# Patient Record
Sex: Female | Born: 1975 | Race: Black or African American | Hispanic: No | Marital: Single | Smoking: Former smoker
Health system: Southern US, Community
[De-identification: ages and names within clinical notes are randomized; demographics above are authoritative.]

## PROBLEM LIST (undated history)

## (undated) ENCOUNTER — Inpatient Hospital Stay (HOSPITAL_COMMUNITY): Payer: Self-pay

## (undated) DIAGNOSIS — R51 Headache: Secondary | ICD-10-CM

## (undated) DIAGNOSIS — E0590 Thyrotoxicosis, unspecified without thyrotoxic crisis or storm: Secondary | ICD-10-CM

## (undated) DIAGNOSIS — N390 Urinary tract infection, site not specified: Secondary | ICD-10-CM

## (undated) DIAGNOSIS — Z973 Presence of spectacles and contact lenses: Secondary | ICD-10-CM

## (undated) DIAGNOSIS — F419 Anxiety disorder, unspecified: Secondary | ICD-10-CM

## (undated) DIAGNOSIS — F32A Depression, unspecified: Secondary | ICD-10-CM

## (undated) DIAGNOSIS — F329 Major depressive disorder, single episode, unspecified: Secondary | ICD-10-CM

## (undated) DIAGNOSIS — R011 Cardiac murmur, unspecified: Secondary | ICD-10-CM

## (undated) HISTORY — PX: NO PAST SURGERIES: SHX2092

---

## 1998-11-07 ENCOUNTER — Emergency Department (HOSPITAL_COMMUNITY): Admission: EM | Admit: 1998-11-07 | Discharge: 1998-11-07 | Payer: Self-pay | Admitting: Emergency Medicine

## 2000-12-02 ENCOUNTER — Emergency Department (HOSPITAL_COMMUNITY): Admission: EM | Admit: 2000-12-02 | Discharge: 2000-12-02 | Payer: Self-pay | Admitting: Emergency Medicine

## 2001-04-14 ENCOUNTER — Other Ambulatory Visit: Admission: RE | Admit: 2001-04-14 | Discharge: 2001-04-14 | Payer: Self-pay | Admitting: Obstetrics and Gynecology

## 2002-04-28 ENCOUNTER — Other Ambulatory Visit: Admission: RE | Admit: 2002-04-28 | Discharge: 2002-04-28 | Payer: Self-pay | Admitting: Obstetrics and Gynecology

## 2003-04-20 ENCOUNTER — Other Ambulatory Visit: Admission: RE | Admit: 2003-04-20 | Discharge: 2003-04-20 | Payer: Self-pay | Admitting: Obstetrics and Gynecology

## 2004-05-02 ENCOUNTER — Other Ambulatory Visit: Admission: RE | Admit: 2004-05-02 | Discharge: 2004-05-02 | Payer: Self-pay | Admitting: Obstetrics and Gynecology

## 2005-06-04 ENCOUNTER — Other Ambulatory Visit: Admission: RE | Admit: 2005-06-04 | Discharge: 2005-06-04 | Payer: Self-pay | Admitting: Obstetrics and Gynecology

## 2006-07-20 ENCOUNTER — Other Ambulatory Visit: Admission: RE | Admit: 2006-07-20 | Discharge: 2006-07-20 | Payer: Self-pay | Admitting: Obstetrics and Gynecology

## 2010-12-08 LAB — OB RESULTS CONSOLE ABO/RH: RH Type: NEGATIVE

## 2011-01-03 ENCOUNTER — Inpatient Hospital Stay (INDEPENDENT_AMBULATORY_CARE_PROVIDER_SITE_OTHER)
Admission: RE | Admit: 2011-01-03 | Discharge: 2011-01-03 | Disposition: A | Discharge status: Home / Self Care | Payer: 59 | Source: Ambulatory Visit | Attending: Family Medicine | Admitting: Family Medicine

## 2011-01-03 DIAGNOSIS — J020 Streptococcal pharyngitis: Secondary | ICD-10-CM

## 2011-12-02 NOTE — L&D Delivery Note (Signed)
Delivery Note At 6:41 PM a viable female was delivered via Vaginal, Spontaneous Delivery (Presentation:vtx ).  APGAR: pending ; weight pending.   Placenta status: Intact, Spontaneous, came out within one minute of delivery.  Cord:  with the following complications: Short.  Anesthesia: Epidural  Episiotomy: None Lacerations: Periurethral Suture Repair: none Est. Blood Loss (mL): 400  Mom to postpartum.  Baby to NICU.  Jaan Fischel D 04/14/2012, 6:57 PM

## 2012-02-10 ENCOUNTER — Encounter (HOSPITAL_COMMUNITY): Payer: Self-pay | Admitting: *Deleted

## 2012-02-10 ENCOUNTER — Inpatient Hospital Stay (HOSPITAL_COMMUNITY)
Admission: AD | Admit: 2012-02-10 | Discharge: 2012-02-11 | DRG: 782 | Disposition: A | Discharge status: Home / Self Care | Payer: 59 | Source: Ambulatory Visit | Attending: Obstetrics and Gynecology | Admitting: Obstetrics and Gynecology

## 2012-02-10 DIAGNOSIS — O3430 Maternal care for cervical incompetence, unspecified trimester: Principal | ICD-10-CM | POA: Diagnosis present

## 2012-02-10 DIAGNOSIS — N883 Incompetence of cervix uteri: Secondary | ICD-10-CM

## 2012-02-10 HISTORY — DX: Major depressive disorder, single episode, unspecified: F32.9

## 2012-02-10 HISTORY — DX: Cardiac murmur, unspecified: R01.1

## 2012-02-10 HISTORY — DX: Depression, unspecified: F32.A

## 2012-02-10 HISTORY — DX: Headache: R51

## 2012-02-10 HISTORY — DX: Urinary tract infection, site not specified: N39.0

## 2012-02-10 MED ORDER — SODIUM CHLORIDE 0.9 % IJ SOLN
INTRAMUSCULAR | Status: AC
  Administered 2012-02-10: 3 mL
  Filled 2012-02-10: qty 3

## 2012-02-10 MED ORDER — SODIUM CHLORIDE 0.9 % IJ SOLN: 3.0000 mL | INTRAMUSCULAR | Status: DC | PRN

## 2012-02-10 MED ORDER — SODIUM CHLORIDE 0.9 % IV SOLN
3.0000 g | Freq: Four times a day (QID) | INTRAVENOUS | Status: DC
  Administered 2012-02-10 – 2012-02-11 (×3): 3 g via INTRAVENOUS
  Filled 2012-02-10 (×4): qty 3

## 2012-02-10 MED ORDER — ZOLPIDEM TARTRATE 10 MG PO TABS: 10.0000 mg | ORAL_TABLET | Freq: Every evening | ORAL | Status: DC | PRN

## 2012-02-10 MED ORDER — DOCUSATE SODIUM 100 MG PO CAPS
100.0000 mg | ORAL_CAPSULE | Freq: Every day | ORAL | Status: DC
  Filled 2012-02-10: qty 1

## 2012-02-10 MED ORDER — ACETAMINOPHEN 325 MG PO TABS: 650.0000 mg | ORAL_TABLET | ORAL | Status: DC | PRN

## 2012-02-10 MED ORDER — CALCIUM CARBONATE ANTACID 500 MG PO CHEW
2.0000 | CHEWABLE_TABLET | ORAL | Status: DC | PRN
  Filled 2012-02-10: qty 2

## 2012-02-10 MED ORDER — INDOMETHACIN 50 MG PO CAPS
50.0000 mg | ORAL_CAPSULE | Freq: Once | ORAL | Status: AC
  Administered 2012-02-10: 50 mg via ORAL
  Filled 2012-02-10: qty 1

## 2012-02-10 MED ORDER — PRENATAL MULTIVITAMIN CH
1.0000 | ORAL_TABLET | Freq: Every day | ORAL | Status: DC
  Filled 2012-02-10: qty 1

## 2012-02-10 MED ORDER — INDOMETHACIN 25 MG PO CAPS
25.0000 mg | ORAL_CAPSULE | Freq: Four times a day (QID) | ORAL | Status: DC
  Administered 2012-02-11 (×3): 25 mg via ORAL
  Filled 2012-02-10 (×7): qty 1

## 2012-02-10 NOTE — MAU Note (Signed)
Sent from office, post [place], possible incompetent cervix.  Pt had preg in HS delivered at 5 1/2 months.

## 2012-02-10 NOTE — MAU Note (Signed)
Pelvic pressure, no pain, denies bleeding or leaking.

## 2012-02-10 NOTE — H&P (Signed)
Eileen Randolph is a 36 y.o. female G3P0020 at [redacted] weeks gestation  presenting for finding of cervix open on her anatomy US.  EDD 07/06/12 by10 week [place].  Pt denies any LOF or VB or siginificant cramping.  Upon close questioning she reveals that at 36 y/o she had a pregnancy that delivered at about 5 months without contractions.  THere are no other known issues this pregnancy. History  Past OB HX Loss at 36 y/o SAB at ?5 months   SAB  No past medical history on file. No past surgical history on file. Family History: family history is not on file. Social History:  does not have a smoking history on file. She does not have any smokeless tobacco history on file. Her alcohol and drug histories not on file.  ROS  Dilation: 1 (visual) Exam by:: Parisa Pinela Last menstrual period 10/07/2011. Maternal Exam:  Uterine Assessment: Contraction frequency is rare.   Abdomen: Fetal presentation: vertex  Introitus: Normal vulva. Normal vagina.    Physical Exam  Constitutional: She is oriented to person, place, and time. She appears well-developed and well-nourished.  Cardiovascular: Normal rate and regular rhythm.   Respiratory: Effort normal and breath sounds normal.  GI: Soft. Bowel sounds are normal.  Genitourinary: Vagina normal and uterus normal.       Cervix by SSE shows bag visible at os about 1cm dilated, cervix visible all around  Neurological: She is alert and oriented to person, place, and time.  Psychiatric: She has a normal mood and affect. Her behavior is normal.       Appropriately tearful    Assessment/Plan: D/w options of expectant management vs attempt at rescue cerclage.  She would like to try the cerclage.  Will admit and place in trendelenburg and give indocin to try and pull membranes of the os.  Will put on Abx as membranes exposed to vagina.  Will reassess for cerclage in 24-48 hours.  Oliver Pila 02/10/2012, 6:30 PM

## 2012-02-11 ENCOUNTER — Encounter (HOSPITAL_COMMUNITY): Payer: Self-pay | Admitting: Anesthesiology

## 2012-02-11 ENCOUNTER — Encounter (HOSPITAL_COMMUNITY): Payer: Self-pay | Admitting: *Deleted

## 2012-02-11 ENCOUNTER — Inpatient Hospital Stay (HOSPITAL_COMMUNITY): Payer: 59 | Admitting: Anesthesiology

## 2012-02-11 ENCOUNTER — Encounter (HOSPITAL_COMMUNITY)
Admission: AD | Disposition: A | Discharge status: Home / Self Care | Payer: Self-pay | Source: Ambulatory Visit | Attending: Obstetrics and Gynecology

## 2012-02-11 DIAGNOSIS — N883 Incompetence of cervix uteri: Secondary | ICD-10-CM | POA: Diagnosis present

## 2012-02-11 HISTORY — PX: CERVICAL CERCLAGE: SHX1329

## 2012-02-11 LAB — CBC
HCT: 34.5 % — ABNORMAL LOW (ref 36.0–46.0)
MCHC: 33 g/dL (ref 30.0–36.0)
MCV: 89.6 fL (ref 78.0–100.0)
RDW: 13.1 % (ref 11.5–15.5)

## 2012-02-11 SURGERY — CERCLAGE, CERVIX, VAGINAL APPROACH
Anesthesia: Spinal | Site: Vagina | Wound class: Clean Contaminated

## 2012-02-11 MED ORDER — PRENATAL MULTIVITAMIN CH
1.0000 | ORAL_TABLET | Freq: Every day | ORAL | Status: DC
  Filled 2012-02-11: qty 1

## 2012-02-11 MED ORDER — ACETAMINOPHEN 325 MG PO TABS
ORAL_TABLET | ORAL | Status: AC
  Administered 2012-02-11: 650 mg via ORAL
  Filled 2012-02-11: qty 2

## 2012-02-11 MED ORDER — ACETAMINOPHEN 325 MG PO TABS
325.0000 mg | ORAL_TABLET | ORAL | Status: DC | PRN
  Administered 2012-02-11: 650 mg via ORAL

## 2012-02-11 MED ORDER — CEFAZOLIN SODIUM 1-5 GM-% IV SOLN
1.0000 g | INTRAVENOUS | Status: DC
  Filled 2012-02-11: qty 50

## 2012-02-11 MED ORDER — 0.9 % SODIUM CHLORIDE (POUR BTL) OPTIME
TOPICAL | Status: DC | PRN
  Administered 2012-02-11: 1000 mL

## 2012-02-11 MED ORDER — KETOROLAC TROMETHAMINE 30 MG/ML IJ SOLN
15.0000 mg | Freq: Once | INTRAMUSCULAR | Status: DC | PRN
  Filled 2012-02-11: qty 1

## 2012-02-11 MED ORDER — LACTATED RINGERS IV SOLN
INTRAVENOUS | Status: DC | PRN
  Administered 2012-02-11 (×2): via INTRAVENOUS

## 2012-02-11 MED ORDER — MIDAZOLAM HCL 2 MG/2ML IJ SOLN: 0.5000 mg | Freq: Once | INTRAMUSCULAR | Status: DC | PRN

## 2012-02-11 MED ORDER — FENTANYL CITRATE 0.05 MG/ML IJ SOLN: 25.0000 ug | INTRAMUSCULAR | Status: DC | PRN

## 2012-02-11 MED ORDER — MEPERIDINE HCL 25 MG/ML IJ SOLN: 6.2500 mg | INTRAMUSCULAR | Status: DC | PRN

## 2012-02-11 MED ORDER — CEFAZOLIN SODIUM 1-5 GM-% IV SOLN
INTRAVENOUS | Status: AC
  Filled 2012-02-11: qty 100

## 2012-02-11 MED ORDER — DOCUSATE SODIUM 100 MG PO CAPS
100.0000 mg | ORAL_CAPSULE | Freq: Every day | ORAL | Status: DC
  Filled 2012-02-11: qty 1

## 2012-02-11 MED ORDER — CEFAZOLIN SODIUM 1-5 GM-% IV SOLN
INTRAVENOUS | Status: DC | PRN
  Administered 2012-02-11: 2 g via INTRAVENOUS

## 2012-02-11 MED ORDER — PROMETHAZINE HCL 25 MG/ML IJ SOLN: 6.2500 mg | INTRAMUSCULAR | Status: DC | PRN

## 2012-02-11 MED ORDER — LACTATED RINGERS IV SOLN: INTRAVENOUS | Status: DC

## 2012-02-11 MED ORDER — BUPIVACAINE IN DEXTROSE 0.75-8.25 % IT SOLN
INTRATHECAL | Status: DC | PRN
  Administered 2012-02-11: 7.5 mg via INTRATHECAL

## 2012-02-11 MED ORDER — CEFAZOLIN SODIUM-DEXTROSE 2-3 GM-% IV SOLR
2.0000 g | INTRAVENOUS | Status: DC
  Filled 2012-02-11: qty 50

## 2012-02-11 SURGICAL SUPPLY — 21 items
CATH ROBINSON RED A/P 16FR (CATHETERS) ×1 IMPLANT
CLOTH BEACON ORANGE TIMEOUT ST (SAFETY) ×2 IMPLANT
COUNTER NEEDLE 1200 MAGNETIC (NEEDLE) ×1 IMPLANT
GLOVE BIO SURGEON STRL SZ7 (GLOVE) ×1 IMPLANT
GLOVE BIO SURGEON STRL SZ8 (GLOVE) ×2 IMPLANT
GLOVE BIOGEL PI IND STRL 6.5 (GLOVE) IMPLANT
GLOVE BIOGEL PI INDICATOR 6.5 (GLOVE) ×1
GLOVE NEODERM STER SZ 7 (GLOVE) ×1 IMPLANT
GLOVE ORTHO TXT STRL SZ7.5 (GLOVE) ×3 IMPLANT
GOWN PREVENTION PLUS LG XLONG (DISPOSABLE) ×3 IMPLANT
NEEDLE MAYO .5 CIRCLE (NEEDLE) ×2 IMPLANT
NS IRRIG 1000ML POUR BTL (IV SOLUTION) ×2 IMPLANT
PACK VAGINAL MINOR WOMEN LF (CUSTOM PROCEDURE TRAY) ×2 IMPLANT
PAD PREP 24X48 CUFFED NSTRL (MISCELLANEOUS) ×2 IMPLANT
SUT PROLENE 1 CTX 30  8455H (SUTURE) ×1
SUT PROLENE 1 CTX 30 8455H (SUTURE) ×1 IMPLANT
SYRINGE TOOMEY DISP (SYRINGE) ×2 IMPLANT
TOWEL OR 17X24 6PK STRL BLUE (TOWEL DISPOSABLE) ×4 IMPLANT
TUBING NON-CON 1/4 X 20 CONN (TUBING) ×2 IMPLANT
WATER STERILE IRR 1000ML POUR (IV SOLUTION) ×2 IMPLANT
YANKAUER SUCT BULB TIP NO VENT (SUCTIONS) ×2 IMPLANT

## 2012-02-11 NOTE — Progress Notes (Signed)
Date of Initial H&P: 02-10-12  History reviewed, patient examined, no change in status, stable for surgery.  Nothing new since this am.

## 2012-02-11 NOTE — Anesthesia Procedure Notes (Signed)
Spinal  Patient location during procedure: OR Start time: 02/11/2012 3:05 PM Staffing Anesthesiologist: Brayton Caves R Performed by: anesthesiologist  Preanesthetic Checklist Completed: patient identified, site marked, surgical consent, pre-op evaluation, timeout performed, IV checked, risks and benefits discussed and monitors and equipment checked Spinal Block Patient position: sitting Prep: DuraPrep Patient monitoring: heart rate, cardiac monitor, continuous pulse ox and blood pressure Approach: midline Location: L3-4 Injection technique: single-shot Needle Needle type: Sprotte  Needle gauge: 24 G Needle length: 9 cm Assessment Sensory level: T4 Additional Notes Patient identified.  Risk benefits discussed including failed block, incomplete pain control, headache, nerve damage, paralysis, blood pressure changes, nausea, vomiting, reactions to medication both toxic or allergic, and postpartum back pain.  Patient expressed understanding and wished to proceed.  All questions were answered.  Sterile technique used throughout procedure.  CSF was clear.  No parasthesia or other complications.  Please see nursing notes for vital signs.

## 2012-02-11 NOTE — Anesthesia Preprocedure Evaluation (Signed)
Anesthesia Evaluation  Patient identified by MRN, date of birth, ID band Patient awake    Reviewed: Allergy & Precautions, H&P , Patient's Chart, lab work & pertinent test results  Airway Mallampati: II      Dental No notable dental hx.    Pulmonary neg pulmonary ROS,  breath sounds clear to auscultation  Pulmonary exam normal       Cardiovascular Exercise Tolerance: Good negative cardio ROS  + Valvular Problems/Murmurs Rhythm:regular Rate:Normal     Neuro/Psych  Headaches, PSYCHIATRIC DISORDERS Depression negative neurological ROS  negative psych ROS   GI/Hepatic negative GI ROS, Neg liver ROS,   Endo/Other  negative endocrine ROS  Renal/GU negative Renal ROS  negative genitourinary   Musculoskeletal   Abdominal Normal abdominal exam  (+)   Peds  Hematology negative hematology ROS (+)   Anesthesia Other Findings   Reproductive/Obstetrics negative OB ROS                           Anesthesia Physical Anesthesia Plan  ASA: II  Anesthesia Plan: Spinal   Post-op Pain Management:    Induction:   Airway Management Planned:   Additional Equipment:   Intra-op Plan:   Post-operative Plan:   Informed Consent: I have reviewed the patients History and Physical, chart, labs and discussed the procedure including the risks, benefits and alternatives for the proposed anesthesia with the patient or authorized representative who has indicated his/her understanding and acceptance.     Plan Discussed with: Anesthesiologist, CRNA and Surgeon  Anesthesia Plan Comments:         Anesthesia Quick Evaluation

## 2012-02-11 NOTE — Op Note (Signed)
Preoperative diagnosis: Incompetent cervix Postoperative diagnosis: Incompetent cervix Procedure: McDonald cervical cerclage Surgeon: Lavina Hamman M.D. Anesthesia: Spinal Findings: Cervix was visually 1 cm dilated with bag of water at the external os. This was able to be reduced with the cerclage and afterwards the cervix was closed. Estimated blood loss: Minimal Complications: None  Procedure in detail: The patient was taken to the operating room placed in the sitting position. Dr. Brayton Caves instilled spinal anesthesia. She was then placed in the dorsosupine position. She was then placed in mobile stirrups. Perineum and external vagina were prepped with Betadine and she was sterilely draped. The bladder was drained with a red Robinson catheter. A weighted speculum was inserted in the vagina and a right angle retractor used anteriorly and laterally. The vagina and cervix were washed with sterile saline. Findings as mentioned above. A McDonald cervical cerclage was placed in a counterclockwise fashion with #1 Prolene and tied anteriorly. This adequately closed the cervix and reduced the membranes. The cervix was then irrigated again and found to be hemostatic. All instruments were removed from the vagina. The cervix was examined and found to be closed in one to 2 cm long. She was taken down from stirrups. She was taken to the recovery in stable condition after tolerating the procedure well. Counts were correct and she had PAS hose on throughout the procedure. She received Ancef 2 g IV the beginning of the procedure.

## 2012-02-11 NOTE — Anesthesia Postprocedure Evaluation (Signed)
Anesthesia Post Note  Patient: Eileen Randolph  Procedure(s) Performed: Procedure(s) (LRB): CERCLAGE CERVICAL (N/A)  Anesthesia type: Spinal  Patient location: PACU  Post pain: Pain level controlled  Post assessment: Post-op Vital signs reviewed  Last Vitals:  Filed Vitals:   02/11/12 1520  BP: 105/53  Pulse: 76  Temp: 37.5 C  Resp: 16    Post vital signs: Reviewed  Level of consciousness: awake  Complications: No apparent anesthesia complications

## 2012-02-11 NOTE — Transfer of Care (Signed)
Immediate Anesthesia Transfer of Care Note  Patient: Eileen Randolph  Procedure(s) Performed: Procedure(s) (LRB): CERCLAGE CERVICAL (N/A)  Patient Location: PACU  Anesthesia Type: Spinal  Level of Consciousness: awake, alert  and oriented  Airway & Oxygen Therapy: Patient Spontanous Breathing  Post-op Assessment: Report given to PACU RN and Post -op Vital signs reviewed and stable  Post vital signs: Reviewed and stable  Complications: No apparent anesthesia complications

## 2012-02-11 NOTE — Progress Notes (Signed)
HD #2, 18W 1D, incompetent cervix Not feeling any ctx.  Reviewed history.  She is G4, P0030, 2 EAB, 1 delivery in early second trimester, felt pressure and when she went to the hospital baby was in vagina.  She had not shared this history with [place] prior to now.   Afeb, VSS +FHT VE- dimple, 1-2 cm long, no BBOW Discussed incompetent cervix, options of bedrest or cerclage.  Discussed cerclage procedure and risks, including possibly causing labor or rupturing membranes.  She wants to proceed with cerclage, scheduled for 1400 today.  Will d/c Unasyn, no reason currently to be on abx, no evidence of chorioamnionitis.

## 2012-02-11 NOTE — Discharge Instructions (Signed)
Pelvic rest, call for bleeding, leaking fluid or contractions

## 2012-02-12 ENCOUNTER — Encounter (HOSPITAL_COMMUNITY): Payer: Self-pay | Admitting: Obstetrics and Gynecology

## 2012-02-23 NOTE — Discharge Summary (Signed)
Physician Discharge Summary  Patient ID: Eileen Randolph MRN: [id number] DOB/AGE: 34-Jun-1977 34 y.o.  Admit date: 02/10/2012 Discharge date:  02/11/2012  Admission Diagnoses:  Incompetent cervix at [redacted] weeks gestation  Discharge Diagnoses: Incompetent cervix at [redacted] weeks gestation Active Problems:  Incompetent cervix   Discharged Condition: good  Hospital Course: Pt admitted on 3-12 by Dr. Senaida Ores due to minimal measurable cervix by u/s in the office, membranes at the external os, put on bedrest and trendelenberg.  My exam on 3-13 confirmed cervix to be a dimple dilated.  She was taken to the OR on the afternoon of 3-13, had McDonald cerclage successfully placed, felt to be stable enough to discharge home on continued bedrest.   Consults: None   Treatments: surgery: Cervical cerclage  Discharge Exam: Blood pressure 123/67, pulse 71, temperature 98.6 F (37 C), temperature source Oral, resp. rate 16, height 5' 4.5" (1.638 m), weight 82.555 kg (182 lb), last menstrual period 10/07/2011, SpO2 100.00%. Cervix closed, 1-2 cm long  Disposition: 01-Home or Self Care  Discharge Orders    Future Orders Please Complete By Expires   PRETERM LABOR:  Includes any of the follwing symptoms that occur between 20 - [redacted] weeks gestation.  If these symptoms are not stopped, preterm labor can result in preterm delivery, placing your baby at risk      Notify physician for menstrual like cramps      Notify physician for uterine contractions.  These may be painless and feel like the uterus is tightening or the baby is  "balling up"      Notify physician for low, dull backache, unrelieved by heat or Tylenol      Notify physician for intestinal cramps, with or without diarrhea, sometimes described as "gas pain"      Notify physician for pelvic pressure      Notify physician for increase or change in vaginal discharge      Notify physician for vaginal bleeding      Notify physician for a general feeling that  "something is not right"      Notify physician for leaking of fluid      Discharge activity: Bedrest      Discharge diet:  No restrictions      Sexual Activity:        Comments:   Do not have intercourse     Medication List  As of 02/23/2012  8:45 AM   TAKE these medications         ibuprofen 100 MG tablet   Commonly known as: ADVIL,MOTRIN   Take 100 mg by mouth every 6 (six) hours as needed. headache      prenatal multivitamin Tabs   Take 1 tablet by mouth daily.           Follow-up Information    Follow up with Kayleah Appleyard D, MD. Schedule an appointment as soon as possible for a visit in 1 week.   Contact information:   [address], [address] [place] [address] [phone]          Signed: Zenaida Niece 02/23/2012, 8:45 AM

## 2012-03-24 ENCOUNTER — Other Ambulatory Visit: Payer: Self-pay | Admitting: Obstetrics and Gynecology

## 2012-03-24 ENCOUNTER — Inpatient Hospital Stay (HOSPITAL_COMMUNITY)
Admission: AD | Admit: 2012-03-24 | Discharge: 2012-03-24 | Disposition: A | Discharge status: Home / Self Care | Payer: 59 | Source: Ambulatory Visit | Attending: Obstetrics and Gynecology | Admitting: Obstetrics and Gynecology

## 2012-03-24 DIAGNOSIS — O4700 False labor before 37 completed weeks of gestation, unspecified trimester: Secondary | ICD-10-CM | POA: Insufficient documentation

## 2012-03-24 MED ORDER — BETAMETHASONE SOD PHOS & ACET 6 (3-3) MG/ML IJ SUSP
12.0000 mg | Freq: Once | INTRAMUSCULAR | Status: AC
  Administered 2012-03-24: 12 mg via INTRAMUSCULAR
  Filled 2012-03-24: qty 2

## 2012-03-25 ENCOUNTER — Inpatient Hospital Stay (HOSPITAL_COMMUNITY)
Admission: AD | Admit: 2012-03-25 | Discharge: 2012-03-25 | Disposition: A | Discharge status: Home / Self Care | Payer: 59 | Source: Ambulatory Visit | Attending: Obstetrics and Gynecology | Admitting: Obstetrics and Gynecology

## 2012-03-25 DIAGNOSIS — O4700 False labor before 37 completed weeks of gestation, unspecified trimester: Secondary | ICD-10-CM | POA: Insufficient documentation

## 2012-03-25 MED ORDER — BETAMETHASONE SOD PHOS & ACET 6 (3-3) MG/ML IJ SUSP
12.0000 mg | Freq: Once | INTRAMUSCULAR | Status: AC
  Administered 2012-03-25: 12 mg via INTRAMUSCULAR
  Filled 2012-03-25: qty 2

## 2012-03-25 NOTE — ED Notes (Signed)
2nd betamethasone injection given. Pt tolerated well.

## 2012-04-14 ENCOUNTER — Inpatient Hospital Stay (HOSPITAL_COMMUNITY)
Admission: AD | Admit: 2012-04-14 | Discharge: 2012-04-16 | DRG: 775 | Disposition: A | Discharge status: Home / Self Care | Payer: 59 | Source: Ambulatory Visit | Attending: Obstetrics and Gynecology | Admitting: Obstetrics and Gynecology

## 2012-04-14 ENCOUNTER — Inpatient Hospital Stay (HOSPITAL_COMMUNITY): Payer: 59 | Admitting: Anesthesiology

## 2012-04-14 ENCOUNTER — Encounter (HOSPITAL_COMMUNITY): Payer: Self-pay | Admitting: Anesthesiology

## 2012-04-14 ENCOUNTER — Encounter (HOSPITAL_COMMUNITY): Payer: Self-pay | Admitting: Obstetrics and Gynecology

## 2012-04-14 ENCOUNTER — Inpatient Hospital Stay (HOSPITAL_COMMUNITY): Payer: 59

## 2012-04-14 ENCOUNTER — Encounter (HOSPITAL_COMMUNITY): Payer: Self-pay | Admitting: *Deleted

## 2012-04-14 DIAGNOSIS — N883 Incompetence of cervix uteri: Secondary | ICD-10-CM | POA: Diagnosis present

## 2012-04-14 DIAGNOSIS — O3430 Maternal care for cervical incompetence, unspecified trimester: Principal | ICD-10-CM | POA: Diagnosis present

## 2012-04-14 DIAGNOSIS — O42919 Preterm premature rupture of membranes, unspecified as to length of time between rupture and onset of labor, unspecified trimester: Secondary | ICD-10-CM | POA: Diagnosis present

## 2012-04-14 LAB — OB RESULTS CONSOLE GC/CHLAMYDIA: Gonorrhea: NEGATIVE

## 2012-04-14 LAB — CBC
MCH: 29.8 pg (ref 26.0–34.0)
MCHC: 33.3 g/dL (ref 30.0–36.0)
MCV: 89.5 fL (ref 78.0–100.0)
Platelets: 222 10*3/uL (ref 150–400)

## 2012-04-14 LAB — OB RESULTS CONSOLE ABO/RH: RH Type: POSITIVE

## 2012-04-14 LAB — OB RESULTS CONSOLE HIV ANTIBODY (ROUTINE TESTING): HIV: NONREACTIVE

## 2012-04-14 LAB — RPR: RPR Ser Ql: NONREACTIVE

## 2012-04-14 MED ORDER — BETAMETHASONE SOD PHOS & ACET 6 (3-3) MG/ML IJ SUSP
12.0000 mg | Freq: Once | INTRAMUSCULAR | Status: AC
  Administered 2012-04-14: 12 mg via INTRAMUSCULAR
  Filled 2012-04-14: qty 2

## 2012-04-14 MED ORDER — TETANUS-DIPHTH-ACELL PERTUSSIS 5-2.5-18.5 LF-MCG/0.5 IM SUSP
0.5000 mL | Freq: Once | INTRAMUSCULAR | Status: DC
  Filled 2012-04-14: qty 0.5

## 2012-04-14 MED ORDER — ZOLPIDEM TARTRATE 5 MG PO TABS: 5.0000 mg | ORAL_TABLET | Freq: Every evening | ORAL | Status: DC | PRN

## 2012-04-14 MED ORDER — MAGNESIUM HYDROXIDE 400 MG/5ML PO SUSP: 30.0000 mL | ORAL | Status: DC | PRN

## 2012-04-14 MED ORDER — FLEET ENEMA 7-19 GM/118ML RE ENEM: 1.0000 | ENEMA | RECTAL | Status: DC | PRN

## 2012-04-14 MED ORDER — ONDANSETRON HCL 4 MG PO TABS: 4.0000 mg | ORAL_TABLET | ORAL | Status: DC | PRN

## 2012-04-14 MED ORDER — LIDOCAINE HCL (PF) 1 % IJ SOLN
INTRAMUSCULAR | Status: DC | PRN
  Administered 2012-04-14 (×2): 4 mL

## 2012-04-14 MED ORDER — OXYTOCIN BOLUS FROM INFUSION
500.0000 mL | Freq: Once | INTRAVENOUS | Status: DC
  Filled 2012-04-14: qty 500
  Filled 2012-04-14: qty 1000

## 2012-04-14 MED ORDER — DIPHENHYDRAMINE HCL 50 MG/ML IJ SOLN
12.5000 mg | INTRAMUSCULAR | Status: DC | PRN
  Administered 2012-04-14 (×2): 12.5 mg via INTRAVENOUS
  Filled 2012-04-14: qty 1

## 2012-04-14 MED ORDER — FENTANYL 2.5 MCG/ML BUPIVACAINE 1/10 % EPIDURAL INFUSION (WH - ANES)
14.0000 mL/h | INTRAMUSCULAR | Status: DC
  Administered 2012-04-14 (×3): 14 mL/h via EPIDURAL
  Filled 2012-04-14 (×4): qty 60

## 2012-04-14 MED ORDER — METHYLERGONOVINE MALEATE 0.2 MG/ML IJ SOLN: 0.2000 mg | INTRAMUSCULAR | Status: DC | PRN

## 2012-04-14 MED ORDER — BUTORPHANOL TARTRATE 2 MG/ML IJ SOLN
1.0000 mg | Freq: Once | INTRAMUSCULAR | Status: AC
  Administered 2012-04-14: 1 mg via INTRAVENOUS
  Filled 2012-04-14: qty 1

## 2012-04-14 MED ORDER — BENZOCAINE-MENTHOL 20-0.5 % EX AERO: 1.0000 "application " | INHALATION_SPRAY | CUTANEOUS | Status: DC | PRN

## 2012-04-14 MED ORDER — MAGNESIUM SULFATE 40 G IN LACTATED RINGERS - SIMPLE
6.0000 g/h | Freq: Once | INTRAVENOUS | Status: DC
  Administered 2012-04-14: 6 g/h via INTRAVENOUS

## 2012-04-14 MED ORDER — LACTATED RINGERS IV SOLN
INTRAVENOUS | Status: DC
  Administered 2012-04-14 (×2): via INTRAVENOUS

## 2012-04-14 MED ORDER — TERBUTALINE SULFATE 1 MG/ML IJ SOLN
0.2500 mg | Freq: Once | INTRAMUSCULAR | Status: AC
  Administered 2012-04-14: 0.25 mg via SUBCUTANEOUS
  Filled 2012-04-14: qty 1

## 2012-04-14 MED ORDER — LIDOCAINE HCL (PF) 1 % IJ SOLN
30.0000 mL | INTRAMUSCULAR | Status: DC | PRN
Start: 2012-04-14 — End: 2012-04-14

## 2012-04-14 MED ORDER — EPHEDRINE 5 MG/ML INJ
10.0000 mg | INTRAVENOUS | Status: DC | PRN
  Filled 2012-04-14: qty 4

## 2012-04-14 MED ORDER — TERBUTALINE SULFATE 1 MG/ML IJ SOLN: 0.2500 mg | Freq: Once | INTRAMUSCULAR | Status: DC | PRN

## 2012-04-14 MED ORDER — PHENYLEPHRINE 40 MCG/ML (10ML) SYRINGE FOR IV PUSH (FOR BLOOD PRESSURE SUPPORT)
80.0000 ug | PREFILLED_SYRINGE | INTRAVENOUS | Status: DC | PRN
  Administered 2012-04-14: 80 ug via INTRAVENOUS

## 2012-04-14 MED ORDER — CEFAZOLIN SODIUM 1-5 GM-% IV SOLN
1.0000 g | Freq: Three times a day (TID) | INTRAVENOUS | Status: DC
  Administered 2012-04-14: 1 g via INTRAVENOUS
  Filled 2012-04-14 (×2): qty 50

## 2012-04-14 MED ORDER — MAGNESIUM SULFATE 40 G IN LACTATED RINGERS - SIMPLE
2.0000 g/h | INTRAVENOUS | Status: DC
  Filled 2012-04-14: qty 500

## 2012-04-14 MED ORDER — ACETAMINOPHEN 325 MG PO TABS: 650.0000 mg | ORAL_TABLET | ORAL | Status: DC | PRN

## 2012-04-14 MED ORDER — PRENATAL MULTIVITAMIN CH
1.0000 | ORAL_TABLET | Freq: Every day | ORAL | Status: DC
  Administered 2012-04-15 – 2012-04-16 (×2): 1 via ORAL
  Filled 2012-04-14 (×2): qty 1

## 2012-04-14 MED ORDER — OXYTOCIN 20 UNITS IN LACTATED RINGERS INFUSION - SIMPLE: 125.0000 mL/h | Freq: Once | INTRAVENOUS | Status: DC

## 2012-04-14 MED ORDER — MEASLES, MUMPS & RUBELLA VAC ~~LOC~~ INJ: 0.5000 mL | INJECTION | Freq: Once | SUBCUTANEOUS | Status: DC

## 2012-04-14 MED ORDER — ONDANSETRON HCL 4 MG/2ML IJ SOLN
4.0000 mg | Freq: Four times a day (QID) | INTRAMUSCULAR | Status: DC | PRN
  Administered 2012-04-14: 4 mg via INTRAVENOUS
  Filled 2012-04-14: qty 2

## 2012-04-14 MED ORDER — OXYCODONE-ACETAMINOPHEN 5-325 MG PO TABS: 1.0000 | ORAL_TABLET | ORAL | Status: DC | PRN

## 2012-04-14 MED ORDER — OXYTOCIN 20 UNITS IN LACTATED RINGERS INFUSION - SIMPLE
1.0000 m[IU]/min | INTRAVENOUS | Status: DC
  Administered 2012-04-14: 1 m[IU]/min via INTRAVENOUS

## 2012-04-14 MED ORDER — SENNOSIDES-DOCUSATE SODIUM 8.6-50 MG PO TABS
2.0000 | ORAL_TABLET | Freq: Every day | ORAL | Status: DC
  Administered 2012-04-14: 2 via ORAL

## 2012-04-14 MED ORDER — IBUPROFEN 600 MG PO TABS
600.0000 mg | ORAL_TABLET | Freq: Four times a day (QID) | ORAL | Status: DC
  Administered 2012-04-14 – 2012-04-15 (×4): 600 mg via ORAL
  Filled 2012-04-14 (×6): qty 1

## 2012-04-14 MED ORDER — AMPICILLIN SODIUM 2 G IJ SOLR
2.0000 g | Freq: Four times a day (QID) | INTRAMUSCULAR | Status: DC
  Administered 2012-04-14: 2 g via INTRAVENOUS
  Filled 2012-04-14 (×2): qty 2000

## 2012-04-14 MED ORDER — LACTATED RINGERS IV SOLN
500.0000 mL | Freq: Once | INTRAVENOUS | Status: AC
  Administered 2012-04-14: 500 mL via INTRAVENOUS

## 2012-04-14 MED ORDER — WITCH HAZEL-GLYCERIN EX PADS: 1.0000 "application " | MEDICATED_PAD | CUTANEOUS | Status: DC | PRN

## 2012-04-14 MED ORDER — ONDANSETRON HCL 4 MG/2ML IJ SOLN: 4.0000 mg | INTRAMUSCULAR | Status: DC | PRN

## 2012-04-14 MED ORDER — OXYTOCIN 20 UNITS IN LACTATED RINGERS INFUSION - SIMPLE
125.0000 mL/h | INTRAVENOUS | Status: DC | PRN
  Filled 2012-04-14: qty 1000

## 2012-04-14 MED ORDER — IBUPROFEN 600 MG PO TABS: 600.0000 mg | ORAL_TABLET | Freq: Four times a day (QID) | ORAL | Status: DC | PRN

## 2012-04-14 MED ORDER — LACTATED RINGERS IV SOLN: INTRAVENOUS | Status: DC

## 2012-04-14 MED ORDER — LANOLIN HYDROUS EX OINT: TOPICAL_OINTMENT | CUTANEOUS | Status: DC | PRN

## 2012-04-14 MED ORDER — METHYLERGONOVINE MALEATE 0.2 MG PO TABS: 0.2000 mg | ORAL_TABLET | ORAL | Status: DC | PRN

## 2012-04-14 MED ORDER — PHENYLEPHRINE 40 MCG/ML (10ML) SYRINGE FOR IV PUSH (FOR BLOOD PRESSURE SUPPORT)
80.0000 ug | PREFILLED_SYRINGE | INTRAVENOUS | Status: DC | PRN
  Filled 2012-04-14: qty 5

## 2012-04-14 MED ORDER — DIBUCAINE 1 % RE OINT: 1.0000 "application " | TOPICAL_OINTMENT | RECTAL | Status: DC | PRN

## 2012-04-14 MED ORDER — SIMETHICONE 80 MG PO CHEW: 80.0000 mg | CHEWABLE_TABLET | ORAL | Status: DC | PRN

## 2012-04-14 MED ORDER — CITRIC ACID-SODIUM CITRATE 334-500 MG/5ML PO SOLN: 30.0000 mL | ORAL | Status: DC | PRN

## 2012-04-14 MED ORDER — LACTATED RINGERS IV SOLN: 500.0000 mL | INTRAVENOUS | Status: DC | PRN

## 2012-04-14 MED ORDER — EPHEDRINE 5 MG/ML INJ: 10.0000 mg | INTRAVENOUS | Status: DC | PRN

## 2012-04-14 MED ORDER — DIPHENHYDRAMINE HCL 25 MG PO CAPS
25.0000 mg | ORAL_CAPSULE | Freq: Four times a day (QID) | ORAL | Status: DC | PRN
Start: 2012-04-14 — End: 2012-04-16

## 2012-04-14 MED ORDER — FENTANYL 2.5 MCG/ML BUPIVACAINE 1/10 % EPIDURAL INFUSION (WH - ANES)
INTRAMUSCULAR | Status: DC | PRN
  Administered 2012-04-14: 14 mL/h via EPIDURAL

## 2012-04-14 MED ORDER — DEXTROSE 5 % IV SOLN
500.0000 mg | Freq: Once | INTRAVENOUS | Status: AC
  Administered 2012-04-14: 500 mg via INTRAVENOUS
  Filled 2012-04-14: qty 500

## 2012-04-14 MED ORDER — MAGNESIUM SULFATE BOLUS VIA INFUSION
6.0000 g | Freq: Once | INTRAVENOUS | Status: AC
  Administered 2012-04-14: 6 g via INTRAVENOUS
  Filled 2012-04-14: qty 500

## 2012-04-14 NOTE — Progress Notes (Signed)
Pt triggered on Low Braden report. Trigger secondary to labor. Previous Braden scores were > 12. Pt at low risk for pressure ulcers because lack of mobility will be short term. Nutrition intervention is not required at this time.

## 2012-04-14 NOTE — H&P (Signed)
NAME:  Eileen Randolph, Eileen Randolph                 ACCOUNT NO.:  [id number]  MEDICAL RECORD NO.:  [id number]  LOCATION:  9162                          FACILITY:  [facility]  PHYSICIAN:  Malachi Pro. Ambrose Mantle, M.D. DATE OF BIRTH:  1976/02/10  DATE OF ADMISSION:  04/14/2012 DATE OF DISCHARGE:                             HISTORY & PHYSICAL   PRESENT ILLNESS:  This is a 36 year old black female, para 0-0-2-0, gravida 3, last period October 07, 2011, with [facility] of July 06, 2012 based on a 10 week ultrasound, and July 13, 2012, by her last menstrual period.  The patient is admitted with ruptured membranes, probable labor and she has a cerclage intact.  LABORATORY DATA:  O positive, negative antibody, rubella immune, RPR nonreactive.  Urine culture negative.  Hepatitis B surface antigen negative, HIV negative, GC and Chlamydia negative.  Hemoglobin electrophoresis AA.  Cystic fibrosis declined.  First trimester screen declined.  AFP declined.  One-hour Glucola 97.  Group B strep has not been done.  The patient's early ultrasound at 10 weeks suggested a due date of July 06, 2012. The patient had an ultrasound at 18 weeks and 6 days, due date is July 07, 2012, and at that time, there was no measurable closed cervix.  The patient underwent a cerclage procedure and received steroid.  She thinks at 24 weeks' gestation.  The date of the procedure of steroids was actually at April 24 and April 25. The cervix had felt to be 1 cm and 50%.  She was seen in the office on Apr 07, 2012, and it was not felt that she needed an examination.  I consulted with Dr. Jackelyn Knife and he felt that it was not necessary since she was having no symptoms.  She awoke this morning with what she thought was bleeding.  She came to the hospital once contracting, and the nurse practitioner examined her and felt like there was 400 mL of blood in the vagina, but once it was cleaned out, there was no more bleeding.  However, ultrasound showed an AFI  of 1 and the nurse practitioner re-evaluated the blood loss as much less, maybe like 150 mL since a large part of it, was just bloody fluid.  PAST MEDICAL HISTORY:  She has had a history of carpal tunnel syndrome. Urinary tract infection, and vitamin D deficiency.  She has no known allergies.  She has had 2 early abortions.  FAMILY HISTORY:  Her father and mother have diabetes.  Father has prostate cancer.  Maternal uncle and aunt and grandfather have colon cancer and an aunt has breast cancer.  The patient denies alcohol, tobacco, and illicit substance abuse.  PHYSICAL EXAM:  VITAL SIGNS:  Temperature is 100 degrees, pulse is 125, respirations 22, blood pressure 114/62. HEART:  Normal size, tachycardia.  No murmurs. LUNGS:  Clear to auscultation. ABDOMEN:  Soft.  Fundal height on Apr 07, 2012, was 26 cm. Fetal heart tones are normal.  Per the nurse practitioner's exam, the cerclage is intact.  Cervix feels closed and there is no active bleeding after the clot and fluid are removed from the vagina.  ADMITTING IMPRESSION:  Intrauterine pregnancy at approximately 27-28  weeks by an ultrasound at 10 weeks.  Her due date is August 6, which would place her gestational age at 69 weeks and 1 day.  By her last menstrual period, she is 1 week earlier.  The patient has ruptured membranes.  She has incompetent cervix.  She is going to be admitted.  There cerclage will be removed and we will place her on antibiotics.  I gave her shot of terbutaline before I knew that her membranes were ruptured.  I will not give her anymore tocolytics.  I am going to give consideration giving her another dose of steroids.     Malachi Pro. Ambrose Mantle, M.D.     TFH/MEDQ  D:  04/14/2012  T:  04/14/2012  Job:  [id number]

## 2012-04-14 NOTE — Progress Notes (Signed)
Dr. Ambrose Mantle at bedside, removed cerclage. Patient is dilating and membranes are ruptured. Bleeding continues.

## 2012-04-14 NOTE — Progress Notes (Signed)
Comfortable with epidural, had hives and itching after Ampicillin Afeb, VSS FHT- Cat I, ctx q 4-5 minutes VE- 4/80/0, vtx Will continue to monitor progress, on Magnesium for CP prophylaxis.  Will not augment unless gets to 5-6 cm dilated.  Will change abx to Ancef due to rxn to Ampicillin, continue Zithromax

## 2012-04-14 NOTE — Progress Notes (Signed)
Feeling ctx and pressure, bleeding with each ctx Afeb, VSS FHT- Cat II, occ variabl and/or early decel, mod variability, ctx not tracing well VE- 8/90/+1, vtx, FSE applied, mod blood Continue pitocin and Ancef, anticipate SVD

## 2012-04-14 NOTE — Anesthesia Preprocedure Evaluation (Addendum)
Anesthesia Evaluation  Patient identified by MRN, date of birth, ID band Patient awake    Reviewed: Allergy & Precautions, H&P , Patient's Chart, lab work & pertinent test results  Airway Mallampati: III TM Distance: >3 FB Neck ROM: Full    Dental No notable dental hx. (+) Teeth Intact   Pulmonary  breath sounds clear to auscultation  Pulmonary exam normal       Cardiovascular negative cardio ROS  + Valvular Problems/Murmurs Rhythm:Regular Rate:Normal     Neuro/Psych  Headaches, negative neurological ROS     GI/Hepatic negative GI ROS, Neg liver ROS,   Endo/Other  negative endocrine ROS  Renal/GU negative Renal ROS  negative genitourinary   Musculoskeletal negative musculoskeletal ROS (+)   Abdominal   Peds  Hematology negative hematology ROS (+)   Anesthesia Other Findings   Reproductive/Obstetrics (+) Pregnancy                          Anesthesia Physical Anesthesia Plan  ASA: II  Anesthesia Plan: Epidural   Post-op Pain Management:    Induction:   Airway Management Planned:   Additional Equipment:   Intra-op Plan:   Post-operative Plan:   Informed Consent: I have reviewed the patients History and Physical, chart, labs and discussed the procedure including the risks, benefits and alternatives for the proposed anesthesia with the patient or authorized representative who has indicated his/her understanding and acceptance.     Plan Discussed with: Anesthesiologist  Anesthesia Plan Comments:         Anesthesia Quick Evaluation

## 2012-04-14 NOTE — MAU Provider Note (Signed)
Chief Complaint:  Vaginal Bleeding and Abd Pain      Eileen Randolph is  36 y.o. [id number].  Patient's last menstrual period was 10/07/2011..  [redacted]w[redacted]d   She presents complaining of vaginal bleeding and abd pain. Onset is described as sudden and has been present for  1 hours. Reports sudden onset of abd pain and bleeding 1 hour ago. Reports vaginal pressure, good fetal movement. Cerclage in place since 17 weeks secondary to incompetent cervix. S/p BMX 3 weeks ago, per patient.  Obstetrical/Gynecological History: OB History    Grav Para Term Preterm Abortions TAB SAB Ect Mult Living   4 1 0 1 1 0 0 1 0 0       Past Medical History: Past Medical History  Diagnosis Date  . Headache   . Heart murmur   . Urinary tract infection   . Depression     no meds currently    Past Surgical History: Past Surgical History  Procedure Date  . No past surgeries   . Cervical cerclage 02/11/2012    Procedure: CERCLAGE CERVICAL;  Surgeon: Lavina Hamman, MD;  Location: [facility];  Service: Gynecology;  Laterality: N/A;    Family History: Family History  Problem Relation Age of Onset  . Anesthesia problems Neg Hx   . Arthritis Mother   . Diabetes Mother   . Heart disease Mother   . Arthritis Father   . Diabetes Father   . Heart disease Paternal Grandmother     Social History: History  Substance Use Topics  . Smoking status: Former Games developer  . Smokeless tobacco: Former Neurosurgeon  . Alcohol Use: No    Allergies: No Known Allergies  Prescriptions prior to admission  Medication Sig Dispense Refill  . ibuprofen (ADVIL,MOTRIN) 100 MG tablet Take 100 mg by mouth every 6 (six) hours as needed. headache      . Prenatal Vit-Fe Fumarate-FA (PRENATAL MULTIVITAMIN) TABS Take 1 tablet by mouth daily.        Review of Systems - Negative except what has been reviewed in the HPI History obtained from the patient  Physical Exam   Last menstrual period 10/07/2011.  General: General appearance - oriented to  person, place, and time and Uncomfortable, crying Mental status - alert, oriented to person, place, and time, anxious Abdomen - gravid, contracting, mod resting tone Focused Gynecological Exam: VULVA: blood stained perineum, VAGINA: large amount of blood and clot cleared from vault, ~400cc blood and ? Fluid cleared from vault. No active bleeding after cleared. CERVIX: cerclage intact, cervix closed, +LUS development, vtx presentation FHR: 140, mod variability, +accels, no decels TOCO: q 1-4 moderate to palp  Labs: No results found for this or any previous visit (from the past 24 hour(s)). Imaging Studies:  No results found.  MD Consult: Dr. Ambrose Mantle informed of patient status through OR nurse, agrees with plan. Additional orders received for Terbutaline .25mg  SQ  3:52 AM Dr. Ambrose Mantle updated with US findings from tech: AFI 1cm, no cervical length. vtx presentation. Dr. Ambrose Mantle in room with patient.  Assessment: Preterm Labor ROM  Plan: Admit to LD Dr. Ambrose Mantle assuming care  [facility] E. 04/14/2012,3:15 AM

## 2012-04-14 NOTE — Op Note (Signed)
The pt was positioned on the delivery table in stirrups and the cerclage was visualized with a speculum and was removed intact.After removal the cervix was 3 cm and 100 % effaced and the vertex was at 0 station.

## 2012-04-14 NOTE — Consult Note (Signed)
Asked to provide prenatal consultation for patient at risk for preterm delivery due to incompetent cervix.  Mother is 36 y.o. G3 P0 who is now 28.[redacted] weeks EGA and had a cerclage in place before she presented in labor today.  She was admitted here about 3 wks ago and she was given a course of betamethasone at that time.  Today she was admitted after PROM and onset of labor, and she was given another dose of betamethasone, mag sulfate (for neuroprotection), and antibiotics.  Despite the mag she has continued to dilate and the cerclage has been removed, and she is being induced..  Patient's aunt and another family member (female) were present at patient's request during my visit.  Discussed usual expectations for preterm infant at [redacted] weeks gestation, including possible needs for DR resuscitation, respiratory support, IV access, and blood products.  Also presented risks of death or serious morbidity, including mental and motor disabilities.  Projected possible length of stay in NICU until 36 - [redacted] wks EGA.  Discussed advantages of feeding with mother's milk (she intends to pump postnatally and eventually breast feed).  Patient was attentive, had appropriate questions, and was appreciative of my input.  Thank you for the consultation.

## 2012-04-14 NOTE — Progress Notes (Signed)
Comfortable Afeb, VSS FHT- Cat II, mod variability, occ variable decel, ctx not tracing well VE- 5/90/+1, vtx, mod blood Since has advanced dilation, will go ahead and augment with pitocin, cont Ancef

## 2012-04-14 NOTE — Anesthesia Procedure Notes (Signed)
Epidural Patient location during procedure: OB Start time: 04/14/2012 6:47 AM  Staffing Anesthesiologist: Niranjan Rufener A. Performed by: anesthesiologist   Preanesthetic Checklist Completed: patient identified, site marked, surgical consent, pre-op evaluation, timeout performed, IV checked, risks and benefits discussed and monitors and equipment checked  Epidural Patient position: sitting Prep: site prepped and draped and DuraPrep Patient monitoring: continuous pulse ox and blood pressure Approach: midline Injection technique: LOR air  Needle:  Needle type: Tuohy  Needle gauge: 17 G Needle length: 9 cm Needle insertion depth: 6 cm Catheter type: closed end flexible Catheter size: 19 Gauge Catheter at skin depth: 11 cm Test dose: negative and Other  Assessment Events: blood not aspirated, injection not painful, no injection resistance, negative IV test and no paresthesia  Additional Notes Patient identified. Risks and benefits discussed including failed block, incomplete  Pain control, post dural puncture headache, nerve damage, paralysis, blood pressure Changes, nausea, vomiting, reactions to medications-both toxic and allergic and post Partum back pain. All questions were answered. Patient expressed understanding and wished to proceed. Sterile technique was used throughout procedure. Epidural site was Dressed with sterile barrier dressing. No paresthesias, signs of intravascular injection Or signs of intrathecal spread were encountered.  Patient was more comfortable after the epidural was dosed. Please see RN's note for documentation of vital signs and FHR which are stable.

## 2012-04-14 NOTE — Progress Notes (Signed)
Report rcvd from [place], [place]. Care of pt resumed at this time. Pt's Magnesium Bolus is running at this time as well as Zithromax .

## 2012-04-14 NOTE — Progress Notes (Signed)
Pt c/o itching everywhere; hives noted on arms, trunk, upper thighs; ampicillin dc'd immediately, benadryl given - E. Bluford Sedler [facility] 04/14/12 @ [phone]

## 2012-04-14 NOTE — MAU Note (Signed)
Large amount of vaginal bleeding. Abdominal pain and contractions every 3-4 minutes. Cerclage placed around 18-19 weeks

## 2012-04-15 LAB — CBC
MCV: 90.2 fL (ref 78.0–100.0)
Platelets: 221 10*3/uL (ref 150–400)
RDW: 13.8 % (ref 11.5–15.5)
WBC: 13.1 10*3/uL — ABNORMAL HIGH (ref 4.0–10.5)

## 2012-04-15 NOTE — Progress Notes (Signed)
UR Chart review completed.  

## 2012-04-15 NOTE — Progress Notes (Signed)
Post Partum Day #1 Subjective: no complaints and baby stable in NICU, not intubated  Objective: Blood pressure 108/70, pulse 99, temperature 98.1 F (36.7 C), temperature source Oral, resp. rate 16, height 5\' 4"  (1.626 m), weight 82.555 kg (182 lb), last menstrual period 10/07/2011, SpO2 98.00%, unknown if currently breastfeeding.  Physical Exam:  General: alert Lochia: appropriate Uterine Fundus: firm   Basename 04/15/12 0515 04/14/12 0325  HGB 8.9* 11.4*  HCT 26.7* 34.2*    Assessment/Plan: Plan for discharge tomorrow, continue routine care   LOS: 1 day   Zakyra Kukuk D 04/15/2012, 8:24 AM

## 2012-04-15 NOTE — Anesthesia Postprocedure Evaluation (Signed)
  Anesthesia Post-op Note  Patient: Eileen Randolph  Procedure(s) Performed: * No procedures listed *  Patient Location: 317   Anesthesia Type: Epidural  Level of Consciousness: awake, alert  and oriented  Airway and Oxygen Therapy: Patient Spontanous Breathing  Post-op Pain: none  Post-op Assessment: Post-op Vital signs reviewed, Patient's Cardiovascular Status Stable, No headache, No backache, No residual numbness and No residual motor weakness  Post-op Vital Signs: Reviewed and stable  Complications: No apparent anesthesia complications

## 2012-04-16 ENCOUNTER — Encounter (HOSPITAL_COMMUNITY)
Admission: RE | Admit: 2012-04-16 | Discharge: 2012-04-16 | Disposition: A | Discharge status: Home / Self Care | Payer: 59 | Source: Ambulatory Visit | Attending: Obstetrics and Gynecology | Admitting: Obstetrics and Gynecology

## 2012-04-16 DIAGNOSIS — O923 Agalactia: Secondary | ICD-10-CM | POA: Insufficient documentation

## 2012-04-16 LAB — CULTURE, BETA STREP (GROUP B ONLY)

## 2012-04-16 NOTE — Progress Notes (Signed)
Pt is discharged in the care of friend Downstairs per ambulatory. Emotional support given to pt. Infant to remain in [place]. Scanty amt. Of vaginal bleeding noted on V-pad. Stable Denies pain or Discomfort.

## 2012-04-16 NOTE — Discharge Instructions (Signed)
As per discharge pamphlet °

## 2012-04-16 NOTE — Discharge Summary (Signed)
Obstetric Discharge Summary Reason for Admission: rupture of membranes Prenatal Procedures: cerclage Intrapartum Procedures: spontaneous vaginal delivery Postpartum Procedures: none Complications-Operative and Postpartum: 1st degree perineal laceration Hemoglobin  Date Value Range Status  04/15/2012 8.9* 12.0-15.0 (g/dL) Final     REPEATED TO VERIFY     DELTA CHECK NOTED     HCT  Date Value Range Status  04/15/2012 26.7* 36.0-46.0 (%) Final    Physical Exam:  General: alert Lochia: appropriate Uterine Fundus: firm  Discharge Diagnoses: Incompetent cervix, PPROM, PTL with delivery  Discharge Information: Date: 04/16/2012 Activity: pelvic rest Diet: routine Medications: Ibuprofen Condition: stable Instructions: refer to practice specific booklet Discharge to: home Follow-up Information    Follow up with Savalas Monje D, MD. Schedule an appointment as soon as possible for a visit in 6 weeks.   Contact information:   [address], [address] [place] [address] [phone]          Newborn Data: Live born female  Birth Weight: 2 lb 1.2 oz (940 g) APGAR: 7, 7   Baby to stay in NICU.  Sallie Staron D 04/16/2012, 7:56 AM

## 2012-04-16 NOTE — Progress Notes (Signed)
PPD #2 Doing well, baby stable in NICU Afeb, VSS Fundus firm, NT at U-2 D/c home today

## 2012-04-16 NOTE — Clinical Social Work Maternal (Signed)
Clinical Social Work Department PSYCHOSOCIAL ASSESSMENT - MATERNAL/CHILD 04/16/2012  Patient:  Eileen Randolph  Account Number:  [id number]  Admit Date:  04/14/2012  [name]    Clinical Social Worker:  Lulu Riding, LCSW   Date/Time:  04/16/2012 09:00 AM  Date Referred:  04/16/2012   Referral source  NICU     Referred reason  NICU   Other referral source:    I:  FAMILY / HOME ENVIRONMENT Child's legal guardian:  PARENT  Guardian - Name Guardian - Age Guardian - Address  Eileen Randolph [address], [place], [place] [address]  Eileen Randolph  [facility]   Other household support members/support persons Other support:   [name] reports having a good support system of friends here in the area and her cousin, whom she states is her best friend.  Her parents live in the [place], which is where she is from.  She thinks her mother will be visiting for about 2 weeks when baby is discharged.  Her father is ill and cannot travel and her mother cannot be away from him for an extended period of time.    II  PSYCHOSOCIAL DATA Information Source:  Patient Interview  Surveyor, quantity and [employer] Employment:   Both parents work for [employer].  [name] has to return to work in a little over 5 weeks as she has been on bedrest since 19 weeks.   Financial resources:  Media planner If [employer] - [place]:    School / Grade:   Maternity Care Coordinator / Child Services Coordination / Early Interventions:  Cultural issues impacting care:   none known    III  STRENGTHS Strengths  Adequate Resources  Compliance with medical plan  Home prepared for Child (including basic supplies)  Other - See comment  Supportive family/friends  Understanding of illness   Strength comment:  SW gave [name] a pediatrician list   IV  RISK FACTORS AND CURRENT PROBLEMS Current Problem:  None   Risk Factor & Current Problem Patient Issue Family Issue Risk Factor /  Current Problem Comment   N N     V  SOCIAL WORK ASSESSMENT SW met with [name] in her third floor/room 317 to introduce myself, complete assessment and evaluate how she is coping with baby's premature birth and admission to NICU.  [name] was welcoming to [facility] visit and states that she is feeling well, although somewhat emotional to be leaving baby in the hospital today.  SW validated her feelings and discussed common emotions related to a NICU experience as well as signs and symptoms of PPD.  SW gave "Feelings After Birth" handout to [facility], but she was somewhat disengaged when we were discussing this.  She states she expected a premature birth since she had been on bedrest, but was surprised that baby was this early.  She reports a good relationship with FOB, but states that they are not in a relationship at this time.  She reports that she will be staying with him until she goes back to work so that she can be closer to the hospital.  She states she has the means to complete the nursery.  She seems to have a good understanding of the situation and no questions or needs at this time.  SW explained support services offered by NICU SW and gave contact information.  SW has no social concerns at this time.  SW also informed [name] of baby's eligibility for SSI and  will assist her in completing the application since she is interested in applying.      VI SOCIAL WORK PLAN Social Work Plan  Psychosocial Support/Ongoing Assessment of Needs   Type of pt/family education:   If child protective services report - county:   If child protective services report - date:   Information/referral to community resources comment:   Other social work plan:

## 2012-05-17 ENCOUNTER — Encounter (HOSPITAL_COMMUNITY)
Admission: RE | Admit: 2012-05-17 | Discharge: 2012-05-17 | Disposition: A | Discharge status: Home / Self Care | Payer: 59 | Source: Ambulatory Visit | Attending: Obstetrics and Gynecology | Admitting: Obstetrics and Gynecology

## 2012-05-17 DIAGNOSIS — O923 Agalactia: Secondary | ICD-10-CM | POA: Insufficient documentation

## 2012-10-29 ENCOUNTER — Emergency Department (HOSPITAL_BASED_OUTPATIENT_CLINIC_OR_DEPARTMENT_OTHER): Payer: 59

## 2012-10-29 ENCOUNTER — Emergency Department (HOSPITAL_BASED_OUTPATIENT_CLINIC_OR_DEPARTMENT_OTHER)
Admission: EM | Admit: 2012-10-29 | Discharge: 2012-10-29 | Disposition: A | Discharge status: Home / Self Care | Payer: 59 | Attending: Emergency Medicine | Admitting: Emergency Medicine

## 2012-10-29 DIAGNOSIS — R059 Cough, unspecified: Secondary | ICD-10-CM | POA: Insufficient documentation

## 2012-10-29 DIAGNOSIS — Z87891 Personal history of nicotine dependence: Secondary | ICD-10-CM | POA: Insufficient documentation

## 2012-10-29 DIAGNOSIS — Z8679 Personal history of other diseases of the circulatory system: Secondary | ICD-10-CM | POA: Insufficient documentation

## 2012-10-29 DIAGNOSIS — Z8659 Personal history of other mental and behavioral disorders: Secondary | ICD-10-CM | POA: Insufficient documentation

## 2012-10-29 DIAGNOSIS — R0602 Shortness of breath: Secondary | ICD-10-CM | POA: Insufficient documentation

## 2012-10-29 DIAGNOSIS — J3489 Other specified disorders of nose and nasal sinuses: Secondary | ICD-10-CM | POA: Insufficient documentation

## 2012-10-29 DIAGNOSIS — R05 Cough: Secondary | ICD-10-CM | POA: Insufficient documentation

## 2012-10-29 DIAGNOSIS — Z87440 Personal history of urinary (tract) infections: Secondary | ICD-10-CM | POA: Insufficient documentation

## 2012-10-29 MED ORDER — IPRATROPIUM BROMIDE 0.02 % IN SOLN
0.5000 mg | Freq: Once | RESPIRATORY_TRACT | Status: AC
Start: 2012-10-29 — End: 2012-10-29
  Administered 2012-10-29: 0.5 mg via RESPIRATORY_TRACT
  Filled 2012-10-29: qty 2.5

## 2012-10-29 MED ORDER — ALBUTEROL SULFATE (5 MG/ML) 0.5% IN NEBU
5.0000 mg | INHALATION_SOLUTION | Freq: Once | RESPIRATORY_TRACT | Status: AC
  Administered 2012-10-29: 5 mg via RESPIRATORY_TRACT
  Filled 2012-10-29: qty 1

## 2012-10-29 MED ORDER — BENZONATATE 100 MG PO CAPS: 200.0000 mg | ORAL_CAPSULE | Freq: Two times a day (BID) | ORAL | Status: DC | PRN

## 2012-10-29 NOTE — ED Notes (Signed)
Patient returned from xray.

## 2012-10-29 NOTE — ED Notes (Signed)
RT at bedside administering nebulizer treatment .  

## 2012-10-29 NOTE — ED Provider Notes (Addendum)
History     CSN: [id number]  Arrival date & time 10/29/12  4098   First MD Initiated Contact with Patient 10/29/12 0327      Chief Complaint  Patient presents with  . cough, congestion     (Consider location/radiation/quality/duration/timing/severity/associated sxs/prior treatment) Patient is a 36 y.o. female presenting with cough. The history is provided by the patient. No language interpreter was used.  Cough This is a new problem. The current episode started 1 to 2 hours ago. The problem occurs constantly. The problem has not changed since onset.The cough is non-productive. There has been no fever. Associated symptoms include shortness of breath. Pertinent negatives include no chest pain, no chills, no sweats, no weight loss, no ear congestion, no ear pain, no headaches, no rhinorrhea, no sore throat, no myalgias, no wheezing and no eye redness. She has tried nothing for the symptoms. The treatment provided no relief. She is not a smoker. Her past medical history does not include bronchitis, pneumonia, bronchiectasis, COPD, emphysema or asthma.    Past Medical History  Diagnosis Date  . Headache   . Heart murmur   . Urinary tract infection   . Depression     no meds currently    Past Surgical History  Procedure Date  . No past surgeries   . Cervical cerclage 02/11/2012    Procedure: CERCLAGE CERVICAL;  Surgeon: Lavina Hamman, MD;  Location: [facility];  Service: Gynecology;  Laterality: N/A;    Family History  Problem Relation Age of Onset  . Anesthesia problems Neg Hx   . Arthritis Mother   . Diabetes Mother   . Heart disease Mother   . Arthritis Father   . Diabetes Father   . Heart disease Paternal Grandmother     History  Substance Use Topics  . Smoking status: Former Games developer  . Smokeless tobacco: Former Neurosurgeon  . Alcohol Use: No    OB History    Grav Para Term Preterm Abortions TAB SAB Ect Mult Living   3 1 0 1 2 2 0 0 0 1       Review of Systems    Constitutional: Negative for chills and weight loss.  HENT: Negative for ear pain, sore throat and rhinorrhea.   Eyes: Negative for redness.  Respiratory: Positive for cough and shortness of breath. Negative for wheezing.   Cardiovascular: Negative for chest pain.  Musculoskeletal: Negative for myalgias.  Neurological: Negative for headaches.    Allergies  Ampicillin  Home Medications  No current outpatient prescriptions on file.  BP 100/56  Pulse 93  Temp 98.8 F (37.1 C) (Oral)  Resp 22  SpO2 100%  Breastfeeding? Unknown  Physical Exam  Nursing note and vitals reviewed. Constitutional: She is oriented to person, place, and time. She appears well-developed and well-nourished. No distress.  HENT:  Head: Normocephalic and atraumatic. Head is without raccoon's eyes, without Battle's sign, without right periorbital erythema and without left periorbital erythema. No trismus in the jaw.  Right Ear: Hearing, tympanic membrane, external ear and ear canal normal. No mastoid tenderness. No hemotympanum.  Left Ear: Hearing, tympanic membrane, external ear and ear canal normal. No mastoid tenderness. No hemotympanum.  Nose: Nose normal. No mucosal edema or rhinorrhea. Right sinus exhibits no maxillary sinus tenderness and no frontal sinus tenderness. Left sinus exhibits no maxillary sinus tenderness and no frontal sinus tenderness.  Mouth/Throat: Uvula is midline, oropharynx is clear and moist and mucous membranes are normal. Mucous membranes are not pale, not dry  and not cyanotic. No dental abscesses or uvula swelling. No oropharyngeal exudate, posterior oropharyngeal edema, posterior oropharyngeal erythema or tonsillar abscesses.  Eyes: Conjunctivae normal are normal. Pupils are equal, round, and reactive to light. Right eye exhibits no discharge. Left eye exhibits no discharge. No scleral icterus.  Neck: Normal range of motion. Neck supple. No JVD present. No tracheal deviation present.   Cardiovascular: Normal rate, regular rhythm, normal heart sounds and intact distal pulses.  Exam reveals no gallop and no friction rub.   No murmur heard. Pulmonary/Chest: Effort normal and breath sounds normal. No stridor. No respiratory distress. She has no wheezes. She has no rales. She exhibits no tenderness.  Abdominal: Soft. Bowel sounds are normal. She exhibits no distension and no mass. There is no tenderness. There is no rebound and no guarding.  Musculoskeletal: Normal range of motion. She exhibits no edema and no tenderness.  Lymphadenopathy:    She has no cervical adenopathy.  Neurological: She is alert and oriented to person, place, and time.  Skin: Skin is warm and dry. No rash noted. She is not diaphoretic. No erythema. No pallor.  Psychiatric: She has a normal mood and affect. Her behavior is normal.    ED Course  Procedures (including critical care time)  Labs Reviewed - No data to display No results found.   No diagnosis found.   Date: 10/29/2012  Rate: 81 bpm  Rhythm: sinus  QRS Axis: normal  Intervals: normal  ST/T Wave abnormalities: normal  Conduction Disutrbances:none  Narrative Interpretation:   Old EKG Reviewed: none available      MDM  Pt reports a mildly productive cough associated with SOB and gasping for air.  She has stable VS, no noted respiratory difficulties.  Plan symptomatic care and a CXR.  Will reassess as the CXR becomes available.0540.  Pt stable, NAD.  Lungs are clear.  Throat is clear with out evidence of angioedema or pharyngitis.  CXR demonstrates no Ptx, PNA, effusion, or consolidation.  She has been resting comfortably, NAD.  Plan discharge home.       Tobin Chad, MD 10/29/12 6295  Tobin Chad, MD 11/09/12 [phone]

## 2012-10-29 NOTE — ED Notes (Signed)
Patient reports that she developed a cough this evening and then had an episode that she felt like she couldn't catch her breath. On assessment speaking full sentences and no cough, no hx of resp. problems

## 2014-10-02 ENCOUNTER — Encounter (HOSPITAL_COMMUNITY): Payer: Self-pay | Admitting: Obstetrics and Gynecology

## 2018-12-01 DIAGNOSIS — U071 COVID-19: Secondary | ICD-10-CM

## 2018-12-01 HISTORY — DX: COVID-19: U07.1

## 2019-09-29 ENCOUNTER — Other Ambulatory Visit: Payer: Self-pay | Admitting: Obstetrics and Gynecology

## 2019-09-29 DIAGNOSIS — N6489 Other specified disorders of breast: Secondary | ICD-10-CM

## 2019-10-11 ENCOUNTER — Other Ambulatory Visit: Payer: Self-pay

## 2019-10-14 ENCOUNTER — Ambulatory Visit
Admission: RE | Admit: 2019-10-14 | Discharge: 2019-10-14 | Disposition: A | Discharge status: Home / Self Care | Payer: 59 | Source: Ambulatory Visit | Attending: Obstetrics and Gynecology | Admitting: Obstetrics and Gynecology

## 2019-10-14 ENCOUNTER — Ambulatory Visit: Payer: Self-pay

## 2019-10-14 ENCOUNTER — Other Ambulatory Visit: Payer: Self-pay

## 2019-10-14 DIAGNOSIS — N6489 Other specified disorders of breast: Secondary | ICD-10-CM

## 2021-03-18 IMAGING — MG MM DIGITAL DIAGNOSTIC UNILAT*R* W/ TOMO W/ CAD
4 series · 4 of 12 positions shown · non-contrast
Comparison: Baseline mammogram dated 09/21/2019.

CLINICAL DATA: Patient was called back from screening mammogram for
a possible asymmetry in the right breast.

EXAM:
DIGITAL DIAGNOSTIC UNILATERAL RIGHT MAMMOGRAM WITH CAD AND TOMO

[R ML synth-2D]
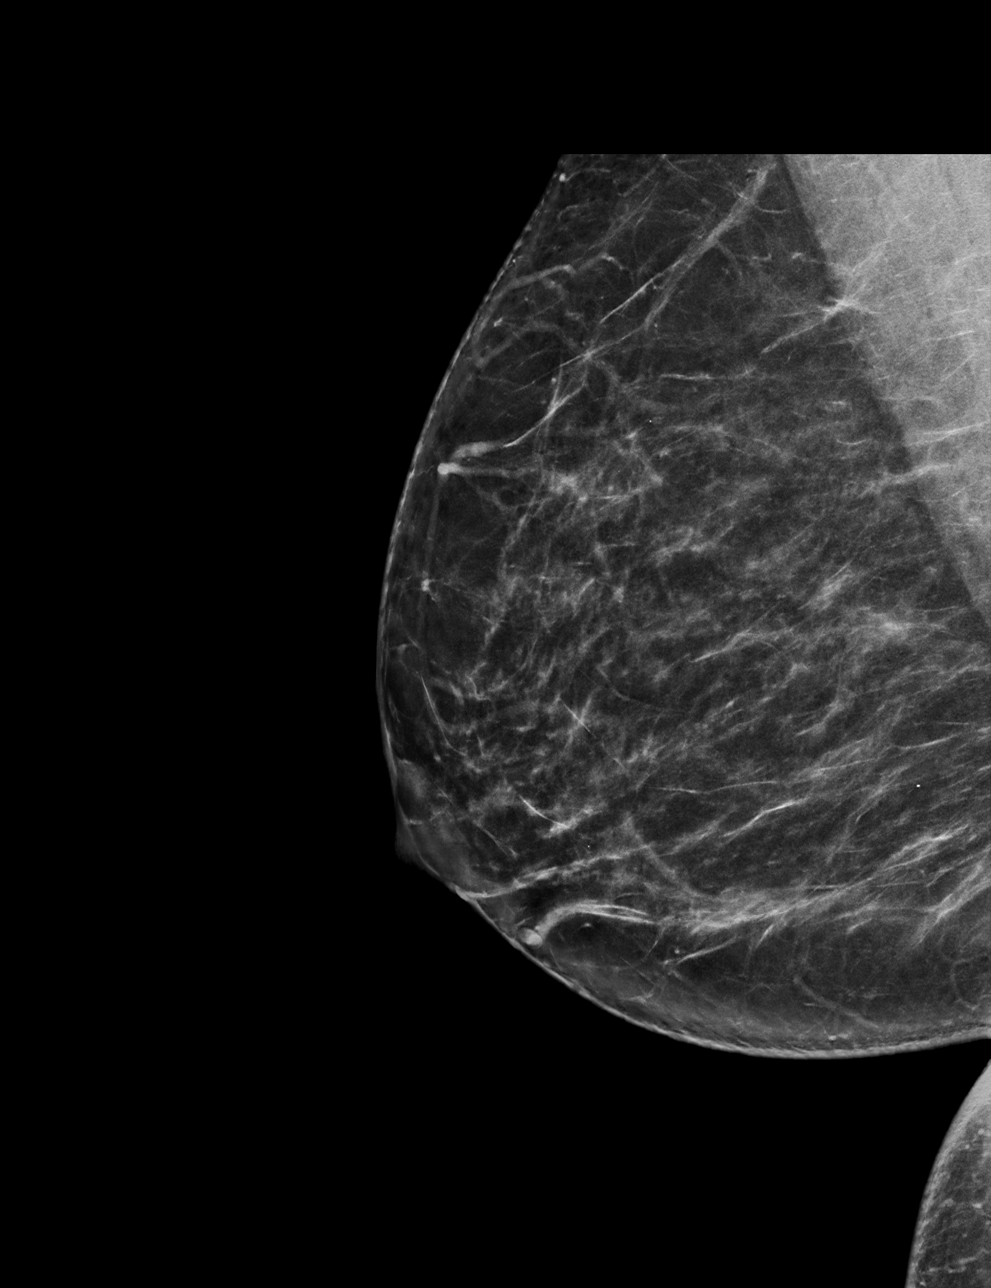

[R MLO synth-2D]
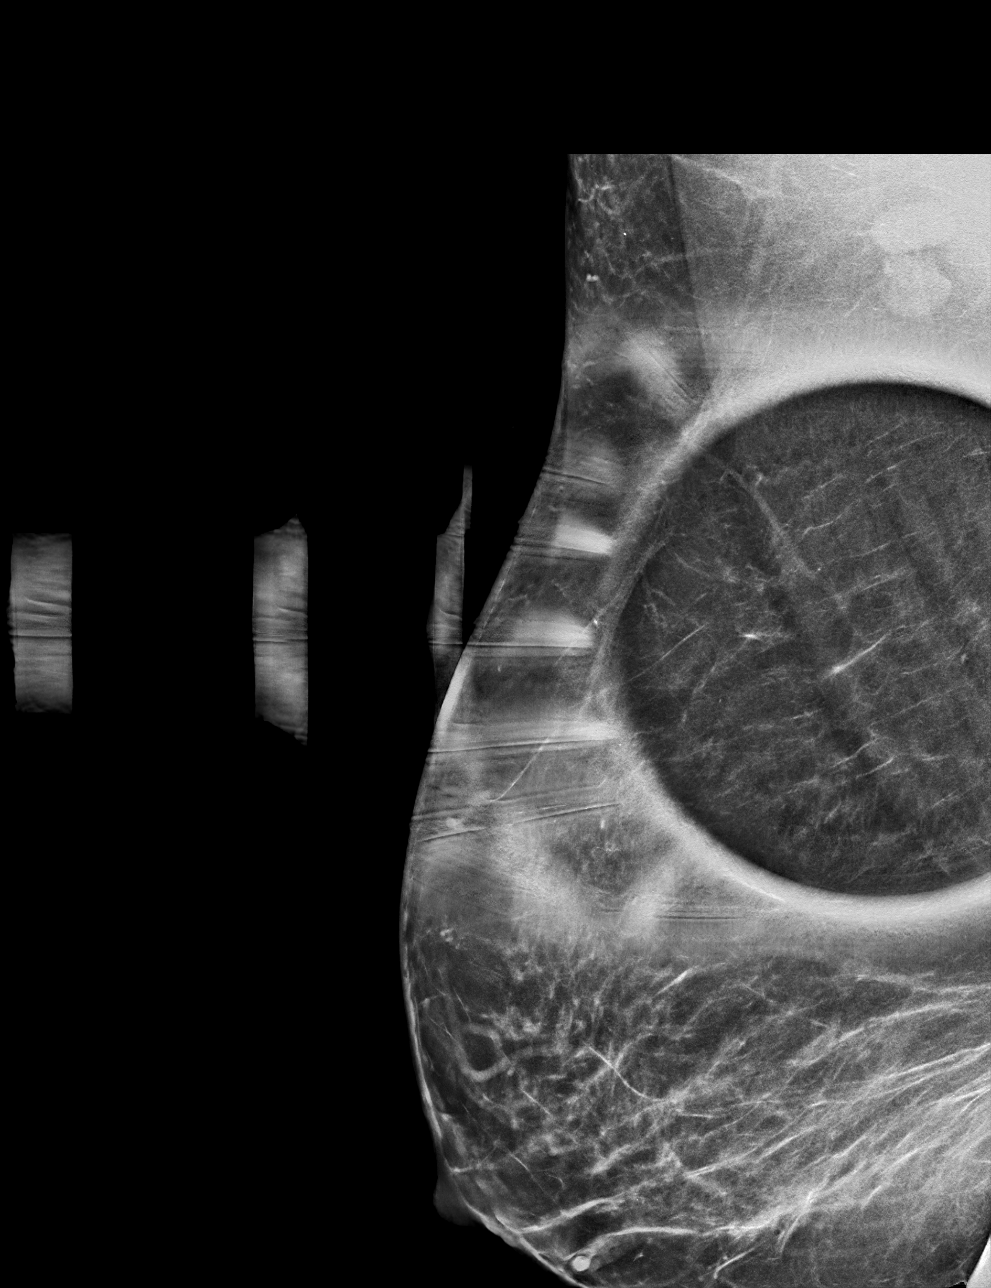

[R MLO tomo · tomo slice 35/69.0]
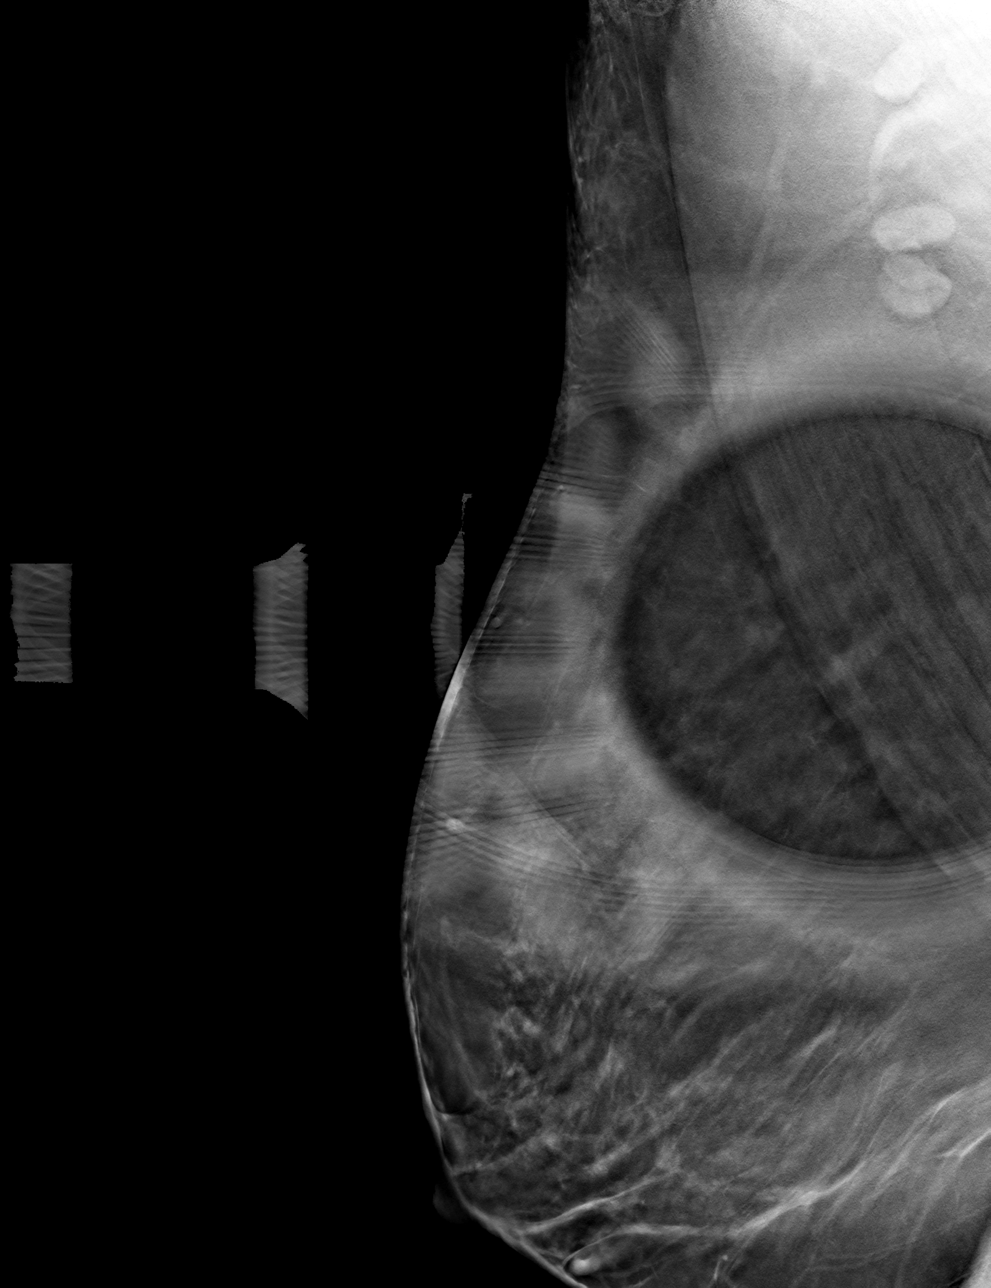

[R ML tomo · tomo slice 35/70.0]
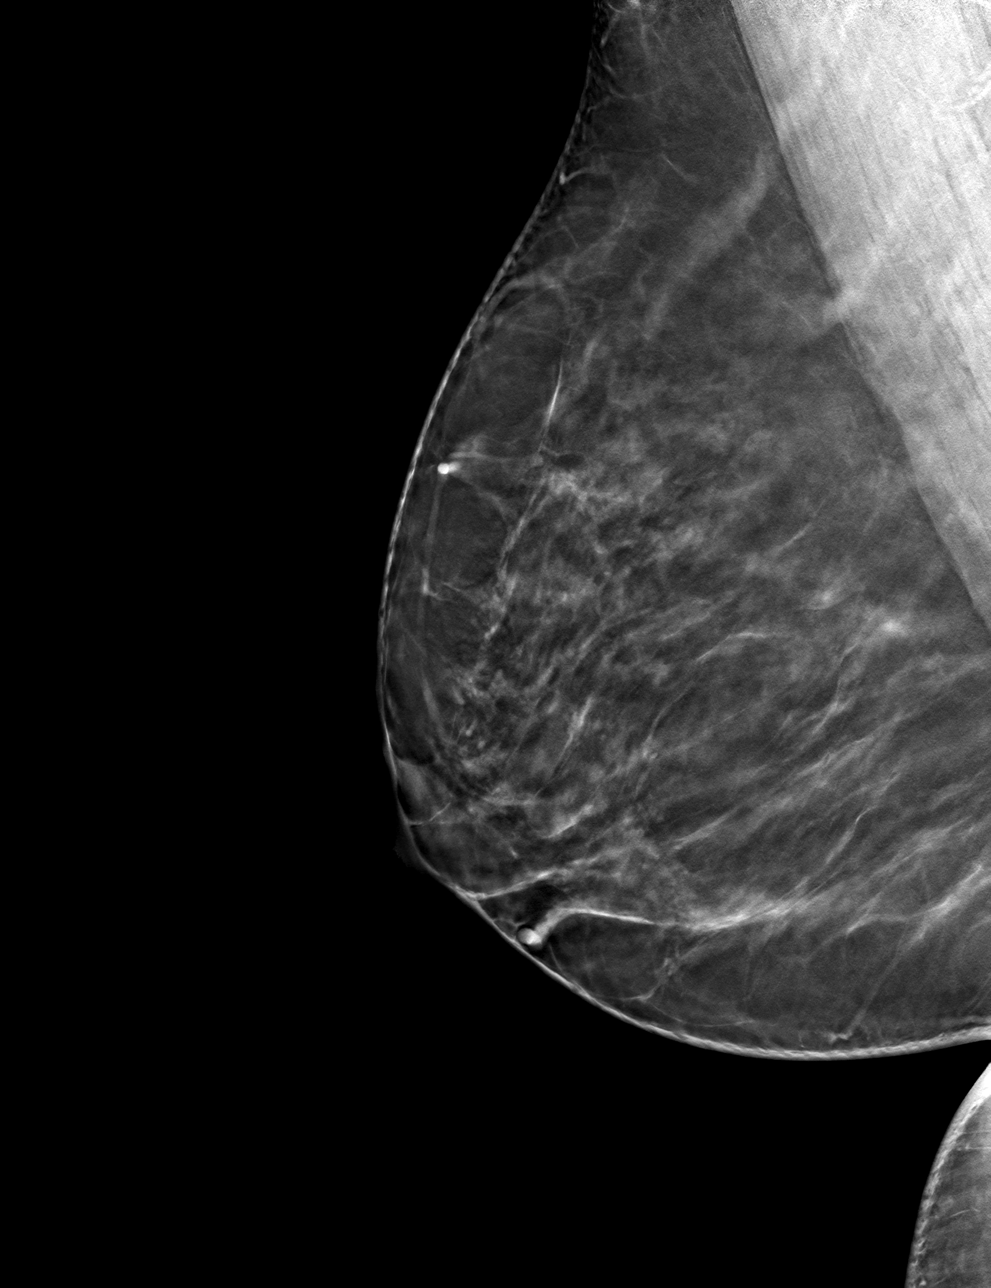

[4 of 12 positions shown; findings below may reference images not displayed]

ACR Breast Density Category b: There are scattered areas of
fibroglandular density.
FINDINGS: Additional imaging of the right breast was performed. No persistent
mass, distortion or malignant type microcalcifications identified.

Mammographic images were processed with CAD.
IMPRESSION: No evidence of malignancy in the right breast.

RECOMMENDATION:
Bilateral screening mammogram in 1 year is recommended.

I have discussed the findings and recommendations with the patient.
If applicable, a reminder letter will be sent to the patient
regarding the next appointment.

BI-RADS CATEGORY  1: Negative.

## 2021-12-01 DIAGNOSIS — N812 Incomplete uterovaginal prolapse: Secondary | ICD-10-CM

## 2021-12-01 HISTORY — DX: Incomplete uterovaginal prolapse: N81.2

## 2022-10-06 ENCOUNTER — Encounter (HOSPITAL_BASED_OUTPATIENT_CLINIC_OR_DEPARTMENT_OTHER): Payer: Self-pay | Admitting: Obstetrics and Gynecology

## 2022-10-07 ENCOUNTER — Other Ambulatory Visit: Payer: Self-pay

## 2022-10-07 ENCOUNTER — Encounter (HOSPITAL_BASED_OUTPATIENT_CLINIC_OR_DEPARTMENT_OTHER): Payer: Self-pay | Admitting: Obstetrics and Gynecology

## 2022-10-07 DIAGNOSIS — K224 Dyskinesia of esophagus: Secondary | ICD-10-CM

## 2022-10-07 HISTORY — DX: Dyskinesia of esophagus: K22.4

## 2022-10-07 NOTE — Progress Notes (Signed)
Your procedure is scheduled on Thursday, 10/16/22.  Report to [facility]   Call this number if you have problems the morning of surgery  :[phone].   OUR ADDRESS IS [place].  [facility] ARE LOCATED IN [facility].  PLEASE BRING YOUR INSURANCE CARD AND PHOTO ID DAY OF SURGERY.  ONLY 2 PEOPLE ARE ALLOWED IN  WAITING  ROOM.                                      REMEMBER:  DO NOT EAT FOOD, CANDY GUM OR MINTS  AFTER MIDNIGHT THE NIGHT BEFORE YOUR SURGERY . YOU MAY HAVE CLEAR LIQUIDS FROM MIDNIGHT THE NIGHT BEFORE YOUR SURGERY UNTIL  4:30 AM. NO CLEAR LIQUIDS AFTER   4:30 AM  DAY OF SURGERY.  YOU MAY  BRUSH YOUR TEETH MORNING OF SURGERY AND RINSE YOUR MOUTH OUT, NO CHEWING GUM CANDY OR MINTS.     CLEAR LIQUID DIET   Foods Allowed                                                                     Foods Excluded  Coffee and tea, regular and decaf                             liquids that you cannot  Plain Jell-O                                                                   see through such as: Fruit ices (not with fruit pulp)                                     milk, soups, orange juice  Plain  Popsicles                                    All solid food Carbonated beverages, regular and diet                                    Cranberry, grape and apple juices Sports drinks like Gatorade _____________________________________________________________________     TAKE ONLY THESE MEDICATIONS MORNING OF SURGERY: Tapazole    UP TO 4 VISITORS  MAY VISIT IN THE EXTENDED RECOVERY ROOM UNTIL 800 PM ONLY.  ONE  VISITOR AGE 44 AND OVER MAY SPEND THE NIGHT AND MUST BE IN EXTENDED RECOVERY ROOM NO LATER THAN 800 PM . YOUR DISCHARGE TIME AFTER YOU SPEND THE NIGHT IS 900 AM THE MORNING AFTER YOUR SURGERY.  YOU MAY PACK A Glad OVERNIGHT BAG WITH TOILETRIES FOR YOUR OVERNIGHT  STAY IF YOU WISH.  YOUR PRESCRIPTION MEDICATIONS WILL BE  PROVIDED DURING [place].                                      DO NOT WEAR JEWERLY, MAKE UP. DO NOT WEAR LOTIONS, POWDERS, PERFUMES OR NAIL POLISH ON YOUR FINGERNAILS. TOENAIL POLISH IS OK TO WEAR. DO NOT SHAVE FOR 48 HOURS PRIOR TO DAY OF SURGERY. MEN MAY SHAVE FACE AND NECK. CONTACTS, GLASSES, OR DENTURES MAY NOT BE WORN TO SURGERY.  REMEMBER: NO SMOKING, DRUGS OR ALCOHOL FOR 24 HOURS BEFORE YOUR SURGERY.                                    [facility] IS NOT RESPONSIBLE  FOR ANY BELONGINGS.                                                                    Marland Kitchen           [place] - Preparing for Surgery Before surgery, you can play an important role.  Because skin is not sterile, your skin needs to be as free of germs as possible.  You can reduce the number of germs on your skin by washing with CHG (chlorahexidine gluconate) soap before surgery.  CHG is an antiseptic cleaner which kills germs and bonds with the skin to continue killing germs even after washing. Please DO NOT use if you have an allergy to CHG or antibacterial soaps.  If your skin becomes reddened/irritated stop using the CHG and inform your nurse when you arrive at Short Stay. Do not shave (including legs and underarms) for at least 48 hours prior to the first CHG shower.  You may shave your face/neck. Please follow these instructions carefully:  1.  Shower with CHG Soap the night before surgery and the  morning of Surgery.  2.  If you choose to wash your hair, wash your hair first as usual with your  normal  shampoo.  3.  After you shampoo, rinse your hair and body thoroughly to remove the  shampoo.                                        4.  Use CHG as you would any other liquid soap.  You can apply chg directly  to the skin and wash , chg soap provided, night before and morning of your surgery.  5.  Apply the CHG Soap to your body ONLY FROM THE NECK DOWN.   Do not use on face/ open                           Wound  or open sores. Avoid contact with eyes, ears mouth and genitals (private parts).                       Wash face,  Genitals (private parts) with your normal soap.  6.  Wash thoroughly, paying special attention to the area where your surgery  will be performed.  7.  Thoroughly rinse your body with warm water from the neck down.  8.  DO NOT shower/wash with your normal soap after using and rinsing off  the CHG Soap.             9.  Pat yourself dry with a clean towel.            10.  Wear clean pajamas.            11.  Place clean sheets on your bed the night of your first shower and do not  sleep with pets. Day of Surgery : Do not apply any lotions/deodorants the morning of surgery.  Please wear clean clothes to the hospital/surgery center.  IF YOU HAVE ANY SKIN IRRITATION OR PROBLEMS WITH THE SURGICAL SOAP, PLEASE GET A BAR OF GOLD DIAL SOAP AND SHOWER THE NIGHT BEFORE YOUR SURGERY AND THE MORNING OF YOUR SURGERY. PLEASE LET THE NURSE KNOW MORNING OF YOUR SURGERY IF YOU HAD ANY PROBLEMS WITH THE SURGICAL SOAP.   ________________________________________________________________________                                                        QUESTIONS [place] PRE OP NURSE PHONE [phone].

## 2022-10-07 NOTE — Progress Notes (Signed)
Spoke w/ via phone for pre-op interview---Eileen Randolph needs dos---- urine pregnancy              Randolph results------10/13/22 cbc, type & screen COVID test -----patient states asymptomatic no test needed Arrive at -------0530 on Thursday, 10/16/22 NPO after MN NO Solid Food.  Clear liquids from MN until---0430 Med rec completed Medications to take morning of surgery -----Tapazole Diabetic medication -----n/a Patient instructed no nail polish to be worn day of surgery Patient instructed to bring photo id and insurance card day of surgery Patient aware to have Driver (ride ) / caregiver    for 24 hours after surgery - partner, Eileen Randolph Patient Special Instructions -----Extended / overnight stay instructions given. Pre-Op special Istructions -----none Patient verbalized understanding of instructions that were given at this phone interview. Patient denies shortness of breath, chest pain, fever, cough at this phone interview.

## 2022-10-13 ENCOUNTER — Encounter (HOSPITAL_COMMUNITY)
Admission: RE | Admit: 2022-10-13 | Discharge: 2022-10-13 | Disposition: A | Discharge status: Home / Self Care | Payer: 59 | Source: Ambulatory Visit | Attending: Obstetrics and Gynecology | Admitting: Obstetrics and Gynecology

## 2022-10-13 DIAGNOSIS — Z01812 Encounter for preprocedural laboratory examination: Secondary | ICD-10-CM | POA: Diagnosis present

## 2022-10-13 DIAGNOSIS — N812 Incomplete uterovaginal prolapse: Secondary | ICD-10-CM | POA: Insufficient documentation

## 2022-10-13 LAB — CBC
HCT: 38.9 % (ref 36.0–46.0)
Hemoglobin: 12.7 g/dL (ref 12.0–15.0)
MCH: 30 pg (ref 26.0–34.0)
MCHC: 32.6 g/dL (ref 30.0–36.0)
MCV: 92 fL (ref 80.0–100.0)
Platelets: 261 10*3/uL (ref 150–400)
RBC: 4.23 MIL/uL (ref 3.87–5.11)
RDW: 12.9 % (ref 11.5–15.5)
WBC: 8.2 10*3/uL (ref 4.0–10.5)
nRBC: 0 % (ref 0.0–0.2)

## 2022-10-15 NOTE — H&P (Signed)
Eileen Randolph is an 46 y.o. female. She has cervical prolapse to the introitus, increasing pelvic discomfort and discomfort with coitus, ready for definitive management.  No significant bleeding with Nexplanon  Pertinent Gynecological History: Last mammogram: normal Date: 08/2022 Last pap: normal Date: 2020 OB History: G4, P0131 SVD at 28 weeks   Menstrual History:  No LMP recorded. (Menstrual status: Irregular Periods).    Past Medical History:  Diagnosis Date   Anxiety    hx of panic attack about a year ago as of 10/07/22   Cervix prolapsed into vagina 2023   COVID-19 2020   flu-like symptoms, no hospitalization   Depression    no meds currently   Esophageal spasm 10/07/2022   Pt stated that if she is completely flat on her back, she will sometimes feel like her esophagus closes. She states that when this happens she has to sit up immediately.   Headache(784.0)    takes otc meds   Heart murmur    no issues, diagnosed as a child   Hyperthyroidism    Follows with PCP , Domenick Gong, MD @ [facility], LOV 09/30/22 per pt.   Urinary tract infection    Wears glasses    readers    Past Surgical History:  Procedure Laterality Date   CERVICAL CERCLAGE  02/11/2012   Procedure: CERCLAGE CERVICAL;  Surgeon: Cheri Fowler, MD;  Location: [facility];  Service: Gynecology;  Laterality: N/A;    Family History  Problem Relation Age of Onset   Arthritis Mother    Diabetes Mother    Heart disease Mother    Arthritis Father    Diabetes Father    Heart disease Paternal Grandmother    Anesthesia problems Neg Hx     Social History:  reports that she quit smoking about 21 years ago. Her smoking use included cigarettes and cigars. She has never used smokeless tobacco. She reports current alcohol use of about 3.0 standard drinks of alcohol per week. She reports that she does not use drugs.  Allergies:  Allergies  Allergen Reactions   Penicillins Hives   Ampicillin Hives and  Itching   Naproxen Sodium Hives    No medications prior to admission.    Review of Systems  Respiratory: Negative.    Cardiovascular: Negative.     Height 5' 4.5" (1.638 m), weight 97.1 kg, not currently breastfeeding. Physical Exam Constitutional:      Appearance: Normal appearance.  Cardiovascular:     Rate and Rhythm: Normal rate and regular rhythm.     Heart sounds: Normal heart sounds. No murmur heard. Pulmonary:     Effort: Pulmonary effort is normal. No respiratory distress.     Breath sounds: Normal breath sounds. No wheezing.  Abdominal:     Palpations: Abdomen is soft. There is no mass.     Tenderness: There is no abdominal tenderness.  Genitourinary:    General: Normal vulva.     Comments: Uterus normal size, cervix is long and comes to introitus No adnexal mass Musculoskeletal:     Cervical back: Normal range of motion and neck supple.  Neurological:     Mental Status: She is alert.     No results found for this or any previous visit (from the past 24 hour(s)).  No results found.  Assessment/Plan: Symptomatic prolapse of cervix to introitus.  Medical and surgical options have been discussed over the last few years, she is now ready for definitive surgical management.  Surgical procedure, risks,  alternatives, chances of relieving her symptoms have all been discussed, questions answered.  Will admit for [facility], possible bilateral salpingectomy, cystoscopy.  Will remove her nexplanon in the office at her post-op visit  Clarene Duke 10/15/2022, 5:06 PM

## 2022-10-16 ENCOUNTER — Other Ambulatory Visit: Payer: Self-pay

## 2022-10-16 ENCOUNTER — Encounter (HOSPITAL_BASED_OUTPATIENT_CLINIC_OR_DEPARTMENT_OTHER): Payer: Self-pay | Admitting: Obstetrics and Gynecology

## 2022-10-16 ENCOUNTER — Ambulatory Visit (HOSPITAL_BASED_OUTPATIENT_CLINIC_OR_DEPARTMENT_OTHER): Payer: 59 | Admitting: Anesthesiology

## 2022-10-16 ENCOUNTER — Ambulatory Visit (HOSPITAL_BASED_OUTPATIENT_CLINIC_OR_DEPARTMENT_OTHER)
Admission: RE | Admit: 2022-10-16 | Discharge: 2022-10-16 | Disposition: A | Discharge status: Home / Self Care | Payer: 59 | Attending: Obstetrics and Gynecology | Admitting: Obstetrics and Gynecology

## 2022-10-16 ENCOUNTER — Encounter (HOSPITAL_BASED_OUTPATIENT_CLINIC_OR_DEPARTMENT_OTHER)
Admission: RE | Disposition: A | Discharge status: Home / Self Care | Payer: Self-pay | Source: Home / Self Care | Attending: Obstetrics and Gynecology

## 2022-10-16 DIAGNOSIS — Z87891 Personal history of nicotine dependence: Secondary | ICD-10-CM | POA: Insufficient documentation

## 2022-10-16 DIAGNOSIS — E0590 Thyrotoxicosis, unspecified without thyrotoxic crisis or storm: Secondary | ICD-10-CM | POA: Diagnosis not present

## 2022-10-16 DIAGNOSIS — D251 Intramural leiomyoma of uterus: Secondary | ICD-10-CM | POA: Insufficient documentation

## 2022-10-16 DIAGNOSIS — N812 Incomplete uterovaginal prolapse: Secondary | ICD-10-CM | POA: Diagnosis present

## 2022-10-16 DIAGNOSIS — Z90710 Acquired absence of both cervix and uterus: Secondary | ICD-10-CM | POA: Diagnosis present

## 2022-10-16 DIAGNOSIS — Z01818 Encounter for other preprocedural examination: Secondary | ICD-10-CM

## 2022-10-16 HISTORY — PX: CYSTOSCOPY: SHX5120

## 2022-10-16 HISTORY — DX: Presence of spectacles and contact lenses: Z97.3

## 2022-10-16 HISTORY — DX: Thyrotoxicosis, unspecified without thyrotoxic crisis or storm: E05.90

## 2022-10-16 HISTORY — PX: VAGINAL HYSTERECTOMY: SHX2639

## 2022-10-16 HISTORY — DX: Anxiety disorder, unspecified: F41.9

## 2022-10-16 LAB — TYPE AND SCREEN
ABO/RH(D): O POS
Antibody Screen: NEGATIVE

## 2022-10-16 LAB — ABO/RH: ABO/RH(D): O POS

## 2022-10-16 LAB — POCT PREGNANCY, URINE: Preg Test, Ur: NEGATIVE

## 2022-10-16 SURGERY — HYSTERECTOMY, VAGINAL
Anesthesia: General

## 2022-10-16 MED ORDER — HYDROMORPHONE HCL 1 MG/ML IJ SOLN: 1.0000 mg | INTRAMUSCULAR | Status: DC | PRN

## 2022-10-16 MED ORDER — LACTATED RINGERS IV SOLN: INTRAVENOUS | Status: DC

## 2022-10-16 MED ORDER — MIDAZOLAM HCL 2 MG/2ML IJ SOLN
INTRAMUSCULAR | Status: AC
  Filled 2022-10-16: qty 2

## 2022-10-16 MED ORDER — BISACODYL 10 MG RE SUPP: 10.0000 mg | Freq: Every day | RECTAL | Status: DC | PRN

## 2022-10-16 MED ORDER — VASOPRESSIN 20 UNIT/ML IV SOLN
INTRAVENOUS | Status: DC | PRN
  Administered 2022-10-16: 7.5 mL via INTRAMUSCULAR

## 2022-10-16 MED ORDER — ROCURONIUM BROMIDE 100 MG/10ML IV SOLN
INTRAVENOUS | Status: DC | PRN
  Administered 2022-10-16: 60 mg via INTRAVENOUS

## 2022-10-16 MED ORDER — FENTANYL CITRATE (PF) 100 MCG/2ML IJ SOLN
INTRAMUSCULAR | Status: AC
  Filled 2022-10-16: qty 2

## 2022-10-16 MED ORDER — ONDANSETRON HCL 4 MG PO TABS: 4.0000 mg | ORAL_TABLET | Freq: Four times a day (QID) | ORAL | Status: DC | PRN

## 2022-10-16 MED ORDER — GABAPENTIN 300 MG PO CAPS: 300.0000 mg | ORAL_CAPSULE | Freq: Three times a day (TID) | ORAL | Status: DC

## 2022-10-16 MED ORDER — ALUM & MAG HYDROXIDE-SIMETH 200-200-20 MG/5ML PO SUSP: 30.0000 mL | ORAL | Status: DC | PRN

## 2022-10-16 MED ORDER — CEFAZOLIN SODIUM-DEXTROSE 2-4 GM/100ML-% IV SOLN
2.0000 g | INTRAVENOUS | Status: AC
  Administered 2022-10-16: 2 g via INTRAVENOUS

## 2022-10-16 MED ORDER — METHIMAZOLE 10 MG PO TABS
10.0000 mg | ORAL_TABLET | Freq: Three times a day (TID) | ORAL | Status: DC
  Filled 2022-10-16: qty 1

## 2022-10-16 MED ORDER — KETOROLAC TROMETHAMINE 30 MG/ML IJ SOLN
30.0000 mg | Freq: Four times a day (QID) | INTRAMUSCULAR | Status: DC
  Administered 2022-10-16: 30 mg via INTRAVENOUS

## 2022-10-16 MED ORDER — ACETAMINOPHEN 325 MG PO TABS: 325.0000 mg | ORAL_TABLET | ORAL | Status: DC | PRN

## 2022-10-16 MED ORDER — OXYCODONE HCL 5 MG/5ML PO SOLN: 5.0000 mg | Freq: Once | ORAL | Status: DC | PRN

## 2022-10-16 MED ORDER — IBUPROFEN 200 MG PO TABS: 600.0000 mg | ORAL_TABLET | Freq: Four times a day (QID) | ORAL | Status: DC

## 2022-10-16 MED ORDER — ACETAMINOPHEN 10 MG/ML IV SOLN
INTRAVENOUS | Status: DC | PRN
  Administered 2022-10-16: 1000 mg via INTRAVENOUS

## 2022-10-16 MED ORDER — FENTANYL CITRATE (PF) 100 MCG/2ML IJ SOLN
INTRAMUSCULAR | Status: DC | PRN
  Administered 2022-10-16 (×2): 50 ug via INTRAVENOUS
  Administered 2022-10-16: 25 ug via INTRAVENOUS
  Administered 2022-10-16: 50 ug via INTRAVENOUS

## 2022-10-16 MED ORDER — ACETAMINOPHEN 500 MG PO TABS
ORAL_TABLET | ORAL | Status: AC
  Filled 2022-10-16: qty 2

## 2022-10-16 MED ORDER — AMISULPRIDE (ANTIEMETIC) 5 MG/2ML IV SOLN: 10.0000 mg | Freq: Once | INTRAVENOUS | Status: DC | PRN

## 2022-10-16 MED ORDER — ACETAMINOPHEN 160 MG/5ML PO SOLN: 325.0000 mg | ORAL | Status: DC | PRN

## 2022-10-16 MED ORDER — OXYCODONE HCL 5 MG PO TABS: 5.0000 mg | ORAL_TABLET | ORAL | Status: DC | PRN

## 2022-10-16 MED ORDER — OXYCODONE HCL 5 MG PO TABS: 5.0000 mg | ORAL_TABLET | ORAL | 0 refills | Status: AC | PRN

## 2022-10-16 MED ORDER — POVIDONE-IODINE 10 % EX SWAB: 2.0000 | Freq: Once | CUTANEOUS | Status: DC

## 2022-10-16 MED ORDER — ONDANSETRON HCL 4 MG/2ML IJ SOLN
INTRAMUSCULAR | Status: DC | PRN
  Administered 2022-10-16: 4 mg via INTRAVENOUS

## 2022-10-16 MED ORDER — GABAPENTIN 300 MG PO CAPS
ORAL_CAPSULE | ORAL | Status: AC
  Filled 2022-10-16: qty 1

## 2022-10-16 MED ORDER — ACETAMINOPHEN 500 MG PO TABS
1000.0000 mg | ORAL_TABLET | Freq: Four times a day (QID) | ORAL | Status: DC
  Administered 2022-10-16: 1000 mg via ORAL

## 2022-10-16 MED ORDER — SUGAMMADEX SODIUM 200 MG/2ML IV SOLN
INTRAVENOUS | Status: DC | PRN
  Administered 2022-10-16: 200 mg via INTRAVENOUS

## 2022-10-16 MED ORDER — FENTANYL CITRATE (PF) 250 MCG/5ML IJ SOLN
INTRAMUSCULAR | Status: AC
  Filled 2022-10-16: qty 5

## 2022-10-16 MED ORDER — SCOPOLAMINE 1 MG/3DAYS TD PT72: 1.0000 | MEDICATED_PATCH | TRANSDERMAL | Status: DC

## 2022-10-16 MED ORDER — ONDANSETRON HCL 4 MG/2ML IJ SOLN: 4.0000 mg | Freq: Four times a day (QID) | INTRAMUSCULAR | Status: DC | PRN

## 2022-10-16 MED ORDER — GABAPENTIN 300 MG PO CAPS: 300.0000 mg | ORAL_CAPSULE | Freq: Three times a day (TID) | ORAL | 0 refills | Status: AC

## 2022-10-16 MED ORDER — MENTHOL 3 MG MT LOZG: 1.0000 | LOZENGE | OROMUCOSAL | Status: DC | PRN

## 2022-10-16 MED ORDER — ACETAMINOPHEN 10 MG/ML IV SOLN
INTRAVENOUS | Status: AC
  Filled 2022-10-16: qty 100

## 2022-10-16 MED ORDER — LIDOCAINE HCL (CARDIAC) PF 100 MG/5ML IV SOSY
PREFILLED_SYRINGE | INTRAVENOUS | Status: DC | PRN
  Administered 2022-10-16: 40 mg via INTRAVENOUS

## 2022-10-16 MED ORDER — PROMETHAZINE HCL 25 MG/ML IJ SOLN: 6.2500 mg | INTRAMUSCULAR | Status: DC | PRN

## 2022-10-16 MED ORDER — DEXTROSE-NACL 5-0.45 % IV SOLN: INTRAVENOUS | Status: DC

## 2022-10-16 MED ORDER — CEFAZOLIN SODIUM-DEXTROSE 2-4 GM/100ML-% IV SOLN
INTRAVENOUS | Status: AC
  Filled 2022-10-16: qty 100

## 2022-10-16 MED ORDER — ACETAMINOPHEN 10 MG/ML IV SOLN: 1000.0000 mg | Freq: Once | INTRAVENOUS | Status: DC | PRN

## 2022-10-16 MED ORDER — OXYCODONE HCL 5 MG PO TABS: 5.0000 mg | ORAL_TABLET | Freq: Once | ORAL | Status: DC | PRN

## 2022-10-16 MED ORDER — KETOROLAC TROMETHAMINE 30 MG/ML IJ SOLN
INTRAMUSCULAR | Status: AC
  Filled 2022-10-16: qty 1

## 2022-10-16 MED ORDER — BUPIVACAINE HCL (PF) 0.5 % IJ SOLN
INTRAMUSCULAR | Status: DC | PRN
  Administered 2022-10-16: 7.5 mL

## 2022-10-16 MED ORDER — MIDAZOLAM HCL 2 MG/2ML IJ SOLN
INTRAMUSCULAR | Status: DC | PRN
  Administered 2022-10-16: 2 mg via INTRAVENOUS

## 2022-10-16 MED ORDER — FENTANYL CITRATE (PF) 100 MCG/2ML IJ SOLN
25.0000 ug | INTRAMUSCULAR | Status: DC | PRN
  Administered 2022-10-16: 25 ug via INTRAVENOUS

## 2022-10-16 MED ORDER — DEXAMETHASONE SODIUM PHOSPHATE 4 MG/ML IJ SOLN
INTRAMUSCULAR | Status: DC | PRN
  Administered 2022-10-16: 8 mg via INTRAVENOUS

## 2022-10-16 MED ORDER — SIMETHICONE 80 MG PO CHEW: 80.0000 mg | CHEWABLE_TABLET | Freq: Four times a day (QID) | ORAL | Status: DC | PRN

## 2022-10-16 MED ORDER — PROPOFOL 10 MG/ML IV BOLUS
INTRAVENOUS | Status: DC | PRN
  Administered 2022-10-16: 150 mg via INTRAVENOUS

## 2022-10-16 SURGICAL SUPPLY — 19 items
CATH ROBINSON RED A/P 16FR (CATHETERS) ×2 IMPLANT
GAUZE 4X4 16PLY ~~LOC~~+RFID DBL (SPONGE) ×4 IMPLANT
GLOVE BIO SURGEON STRL SZ7 (GLOVE) IMPLANT
GLOVE BIOGEL PI IND STRL 8 (GLOVE) ×2 IMPLANT
GLOVE ORTHO TXT STRL SZ7.5 (GLOVE) ×2 IMPLANT
GOWN STRL REUS W/TWL LRG LVL3 (GOWN DISPOSABLE) IMPLANT
GOWN STRL REUS W/TWL XL LVL3 (GOWN DISPOSABLE) ×2 IMPLANT
KIT TURNOVER CYSTO (KITS) ×2 IMPLANT
PACK VAGINAL WOMENS (CUSTOM PROCEDURE TRAY) ×2 IMPLANT
PAD OB MATERNITY 4.3X12.25 (PERSONAL CARE ITEMS) ×2 IMPLANT
SET IRRIG Y TYPE TUR BLADDER L (SET/KITS/TRAYS/PACK) IMPLANT
SHEARS FOC LG CVD HARMONIC 17C (MISCELLANEOUS) ×2 IMPLANT
SUT CHROMIC 1MO 4 18 CR8 (SUTURE) ×2 IMPLANT
SUT SILK 2 0 SH (SUTURE) ×2 IMPLANT
SUT VIC AB 2-0 CT1 27 (SUTURE) ×1
SUT VIC AB 2-0 CT1 TAPERPNT 27 (SUTURE) ×2 IMPLANT
SUT VIC AB 3-0 SH 27 (SUTURE) ×1
SUT VIC AB 3-0 SH 27X BRD (SUTURE) IMPLANT
TOWEL OR 17X26 10 PK STRL BLUE (TOWEL DISPOSABLE) ×2 IMPLANT

## 2022-10-16 NOTE — Anesthesia Preprocedure Evaluation (Addendum)
Anesthesia Evaluation  Patient identified by MRN, date of birth, ID band Patient awake    Reviewed: Allergy & Precautions, NPO status , Patient's Chart, lab work & pertinent test results  Airway Mallampati: II  TM Distance: >3 FB     Dental  (+) Poor Dentition, Missing, Dental Advisory Given   Pulmonary former smoker   breath sounds clear to auscultation       Cardiovascular negative cardio ROS  Rhythm:Regular Rate:Normal     Neuro/Psych  Headaches PSYCHIATRIC DISORDERS Anxiety Depression       GI/Hepatic negative GI ROS, Neg liver ROS,,,  Endo/Other   Hyperthyroidism   Renal/GU negative Renal ROS     Musculoskeletal   Abdominal   Peds  Hematology negative hematology ROS (+)   Anesthesia Other Findings   Reproductive/Obstetrics                             Anesthesia Physical Anesthesia Plan  ASA: 2  Anesthesia Plan: General   Post-op Pain Management:    Induction: Intravenous  PONV Risk Score and Plan: 4 or greater and Ondansetron, Dexamethasone, Midazolam and Scopolamine patch - Pre-op  Airway Management Planned: Oral ETT  Additional Equipment: None  Intra-op Plan:   Post-operative Plan: Extubation in OR  Informed Consent: I have reviewed the patients History and Physical, chart, labs and discussed the procedure including the risks, benefits and alternatives for the proposed anesthesia with the patient or authorized representative who has indicated his/her understanding and acceptance.     Dental advisory given  Plan Discussed with: CRNA  Anesthesia Plan Comments:        Anesthesia Quick Evaluation

## 2022-10-16 NOTE — Anesthesia Postprocedure Evaluation (Signed)
Anesthesia Post Note  Patient: Eileen Randolph  Procedure(s) Performed: HYSTERECTOMY VAGINAL, POSSIBLE BILATERAL SALPINGECTOMY CYSTOSCOPY     Patient location during evaluation: PACU Anesthesia Type: General Level of consciousness: awake and alert Pain management: pain level controlled Vital Signs Assessment: post-procedure vital signs reviewed and stable Respiratory status: spontaneous breathing, nonlabored ventilation, respiratory function stable and patient connected to nasal cannula oxygen Cardiovascular status: blood pressure returned to baseline and stable Postop Assessment: no apparent nausea or vomiting Anesthetic complications: no   No notable events documented.  Last Vitals:  Vitals:   10/16/22 1057 10/16/22 1148  BP: 121/63 120/63  Pulse: 80 87  Resp: 14 15  Temp: 36.4 C 36.7 C  SpO2: 98% 100%                Effie Berkshire

## 2022-10-16 NOTE — Op Note (Signed)
Preoperative diagnosis: Prolapsing cervix Postop diagnosis: Same Procedure: Total vaginal hysterectomy, bilateral salpingectomy and cystoscopy Surgeon: Cheri Fowler M.D. Assistant: Paula Compton M.D. Anesthesia: Gen. Findings: Patient had a normal uterus with normal tubes and ovaries. Via cystoscopy bladder was normal and both ureters were patent.  Assistance from Dr. Marvel Plan was necessary throughout the case to help expose pedicles and control bleeding Estimated blood loss: 50 cc Specimens: Uterus and tubes for routine pathology Complications: None   Procedure in detail: The patient was taken to the operating room and placed in the dorsosupine position. General anesthesia was induced and she was placed in mobile stirrups. Legs were elevated in the stirrups. Perineum and vagina were then prepped and draped in the usual sterile fashion, bladder drained with red Robinson catheter. A Graves speculum was inserted in the vagina and the cervix was grasped with Ardis Hughs tenaculum. Dilute Pitressin with Marcaine was then instilled at the cervicovaginal junction which was then incised circumferentially with electrocautery. Sharp dissection was then used to further free the vagina from the cervix. Anterior peritoneum was identified and entered sharply. A Deaver retractor was used to retract the bladder anteriorly. Posterior cul-de-sac was identified and entered sharply. A Bonnano speculum was placed into the posterior cul-de-sac. Uterosacral ligaments were clamped transected and ligated with #1 chromic and tagged for later use. The remaining pedicles were then taken down with harmonic scalpel.  When just the tubes were still attached, the uterus was delivered posteriorly and both tubes were identified and traced to their fimbria.  Harmonic scalpel was the used to free the tubes and any remaining attachments of the the uterus, the uterus and tubes were removed. Bleeding controlled with harmonic scalpel and  one figure 8 3-0 Vicryl.  Ovaries were inspected and found to be normal.  The uterosacral ligaments were then plicated in the midline with 2-0 silk after the Bonnano speculum was removed and a shorter vaginal speculum was placed. The previously tagged uterosacral pedicles were also tied in the midline. No significant bleeding was identified. The vagina was then closed in a vertical fashion with running locking 2-0 Vicryl with adequate closure and adequate hemostasis.   Attention was turned to cystoscopy.  A 30 cystoscope was inserted and 200 cc of fluid was instilled. The bladder appeared normal. Both ureteral orifices were easily identified and urine was seen to flow freely from each orifice. The cystoscope was removed after the bladder was drained. The patient was taken down from stirrups. She was awakened in the operating room and taken to the recovery room in stable condition after tolerating the procedure well. Counts were correct, she had PAS hose on throughout the procedure, she received Ancef preop.

## 2022-10-16 NOTE — Progress Notes (Signed)
Post-op check, s/p TVH  Doing well, mild-mod pain, no n.v, has ambulated, tol PO, voiding ok Afeb, VSS Abd- mild/appropriate tenderness  D/c home

## 2022-10-16 NOTE — Transfer of Care (Signed)
Immediate Anesthesia Transfer of Care Note  Patient: Eileen Randolph  Procedure(s) Performed: HYSTERECTOMY VAGINAL, POSSIBLE BILATERAL SALPINGECTOMY CYSTOSCOPY  Patient Location: PACU  Anesthesia Type:General  Level of Consciousness: awake and patient cooperative  Airway & Oxygen Therapy: Patient Spontanous Breathing and Patient connected to nasal cannula oxygen  Post-op Assessment: Report given to RN and Post -op Vital signs reviewed and stable  Post vital signs: Reviewed and stable  Last Vitals:  Vitals Value Taken Time  BP 115/67 10/16/22 0905  Temp 37 C 10/16/22 0905  Pulse 80 10/16/22 0906  Resp 19 10/16/22 0906  SpO2 98 % 10/16/22 0906  Vitals shown include unvalidated device data.  Last Pain:  Vitals:   10/16/22 0553  TempSrc: Oral      Patients Stated Pain Goal: 4 (82/08/13 8871)  Complications: No notable events documented.

## 2022-10-16 NOTE — Anesthesia Procedure Notes (Signed)
Procedure Name: Intubation Date/Time: 10/16/2022 7:41 AM  Performed by: Georgeanne Nim, CRNAPre-anesthesia Checklist: Patient identified, Emergency Drugs available, Suction available, Patient being monitored and Timeout performed Patient Re-evaluated:Patient Re-evaluated prior to induction Oxygen Delivery Method: Circle system utilized Preoxygenation: Pre-oxygenation with 100% oxygen Induction Type: IV induction Ventilation: Mask ventilation without difficulty Laryngoscope Size: Mac and 4 Grade View: Grade I Tube type: Oral Tube size: 7.0 mm Number of attempts: 1 Airway Equipment and Method: Stylet Placement Confirmation: ETT inserted through vocal cords under direct vision, positive ETCO2, CO2 detector and breath sounds checked- equal and bilateral Secured at: 21 cm Tube secured with: Tape Dental Injury: Teeth and Oropharynx as per pre-operative assessment

## 2022-10-16 NOTE — Discharge Instructions (Addendum)
Routine instructions for vaginal hysterectomy   Post Anesthesia Home Care Instructions  Activity: Get plenty of rest for the remainder of the day. A responsible individual must stay with you for 24 hours following the procedure.  For the next 24 hours, DO NOT: -Drive a car -Paediatric nurse -Drink alcoholic beverages -Take any medication unless instructed by your physician -Make any legal decisions or sign important papers.  Meals: Start with liquid foods such as gelatin or soup. Progress to regular foods as tolerated. Avoid greasy, spicy, heavy foods. If nausea and/or vomiting occur, drink only clear liquids until the nausea and/or vomiting subsides. Call your physician if vomiting continues.  Special Instructions/Symptoms: Your throat may feel dry or sore from the anesthesia or the breathing tube placed in your throat during surgery. If this causes discomfort, gargle with warm salt water. The discomfort should disappear within 24 hours.    Tylenol every 6hrs as needed next dose at 9 p.m.

## 2022-10-16 NOTE — Interval H&P Note (Signed)
History and Physical Interval Note:  10/16/2022 7:16 AM  Eileen Randolph  has presented today for surgery, with the diagnosis of Cervical Prolapse.  The various methods of treatment have been discussed with the patient and family. After consideration of risks, benefits and other options for treatment, the patient has consented to  Procedure(s): HYSTERECTOMY VAGINAL, POSSIBLE BILATERAL SALPINGECTOMY (N/A) CYSTOSCOPY (N/A) as a surgical intervention.  The patient's history has been reviewed, patient examined, no change in status, stable for surgery.  I have reviewed the patient's chart and labs.  Questions were answered to the patient's satisfaction.     Blane Ohara Jhada Risk

## 2022-10-17 ENCOUNTER — Encounter (HOSPITAL_BASED_OUTPATIENT_CLINIC_OR_DEPARTMENT_OTHER): Payer: Self-pay | Admitting: Obstetrics and Gynecology

## 2022-10-17 LAB — SURGICAL PATHOLOGY
